# Patient Record
Sex: Female | Born: 1990 | Race: White | Hispanic: No | Marital: Married | Smoking: Never smoker
Health system: Southern US, Community
[De-identification: ages and names within clinical notes are randomized; demographics above are authoritative.]

## PROBLEM LIST (undated history)

## (undated) ENCOUNTER — Inpatient Hospital Stay (HOSPITAL_COMMUNITY): Payer: Self-pay

## (undated) DIAGNOSIS — Z789 Other specified health status: Secondary | ICD-10-CM

---

## 2011-10-16 ENCOUNTER — Other Ambulatory Visit: Payer: Self-pay | Admitting: Obstetrics and Gynecology

## 2011-10-16 DIAGNOSIS — N63 Unspecified lump in unspecified breast: Secondary | ICD-10-CM

## 2011-10-17 ENCOUNTER — Ambulatory Visit
Admission: RE | Admit: 2011-10-17 | Discharge: 2011-10-17 | Disposition: A | Discharge status: Home / Self Care | Payer: 59 | Source: Ambulatory Visit | Attending: Obstetrics and Gynecology | Admitting: Obstetrics and Gynecology

## 2011-10-17 DIAGNOSIS — N63 Unspecified lump in unspecified breast: Secondary | ICD-10-CM

## 2012-01-25 LAB — OB RESULTS CONSOLE ANTIBODY SCREEN: Antibody Screen: NEGATIVE

## 2012-01-25 LAB — OB RESULTS CONSOLE RUBELLA ANTIBODY, IGM: Rubella: IMMUNE

## 2012-01-25 LAB — OB RESULTS CONSOLE RPR: RPR: NONREACTIVE

## 2012-01-25 LAB — OB RESULTS CONSOLE ABO/RH: RH Type: NEGATIVE

## 2012-01-25 LAB — OB RESULTS CONSOLE HIV ANTIBODY (ROUTINE TESTING): HIV: NONREACTIVE

## 2012-08-14 LAB — OB RESULTS CONSOLE GBS: GBS: NEGATIVE

## 2012-09-01 ENCOUNTER — Inpatient Hospital Stay (HOSPITAL_COMMUNITY)
Admission: AD | Admit: 2012-09-01 | Discharge: 2012-09-05 | DRG: 766 | Disposition: A | Discharge status: Home / Self Care | Payer: 59 | Source: Ambulatory Visit | Attending: Obstetrics and Gynecology | Admitting: Obstetrics and Gynecology

## 2012-09-01 ENCOUNTER — Encounter (HOSPITAL_COMMUNITY): Payer: Self-pay | Admitting: *Deleted

## 2012-09-01 DIAGNOSIS — O339 Maternal care for disproportion, unspecified: Secondary | ICD-10-CM | POA: Diagnosis present

## 2012-09-01 DIAGNOSIS — O4290 Premature rupture of membranes, unspecified as to length of time between rupture and onset of labor, unspecified weeks of gestation: Secondary | ICD-10-CM | POA: Diagnosis present

## 2012-09-01 DIAGNOSIS — O330 Maternal care for disproportion due to deformity of maternal pelvic bones: Principal | ICD-10-CM | POA: Diagnosis present

## 2012-09-01 DIAGNOSIS — O9903 Anemia complicating the puerperium: Secondary | ICD-10-CM | POA: Diagnosis not present

## 2012-09-01 DIAGNOSIS — Z3480 Encounter for supervision of other normal pregnancy, unspecified trimester: Secondary | ICD-10-CM

## 2012-09-01 DIAGNOSIS — D649 Anemia, unspecified: Secondary | ICD-10-CM | POA: Diagnosis not present

## 2012-09-01 LAB — CBC
HCT: 31.2 % — ABNORMAL LOW (ref 36.0–46.0)
Hemoglobin: 10.4 g/dL — ABNORMAL LOW (ref 12.0–15.0)
MCH: 30.3 pg (ref 26.0–34.0)
MCHC: 33.3 g/dL (ref 30.0–36.0)
RBC: 3.43 MIL/uL — ABNORMAL LOW (ref 3.87–5.11)

## 2012-09-01 MED ORDER — ONDANSETRON HCL 4 MG/2ML IJ SOLN
4.0000 mg | Freq: Four times a day (QID) | INTRAMUSCULAR | Status: DC | PRN
  Administered 2012-09-02: 4 mg via INTRAVENOUS
  Filled 2012-09-01: qty 2

## 2012-09-01 MED ORDER — LACTATED RINGERS IV SOLN
INTRAVENOUS | Status: DC
  Administered 2012-09-02 (×2): via INTRAVENOUS
  Administered 2012-09-02: 125 mL/h via INTRAVENOUS

## 2012-09-01 MED ORDER — OXYTOCIN 40 UNITS IN LACTATED RINGERS INFUSION - SIMPLE MED
62.5000 mL/h | Freq: Once | INTRAVENOUS | Status: DC
  Filled 2012-09-01: qty 1000

## 2012-09-01 MED ORDER — IBUPROFEN 600 MG PO TABS: 600.0000 mg | ORAL_TABLET | Freq: Four times a day (QID) | ORAL | Status: DC | PRN

## 2012-09-01 MED ORDER — LIDOCAINE HCL (PF) 1 % IJ SOLN
30.0000 mL | INTRAMUSCULAR | Status: DC | PRN
  Filled 2012-09-01: qty 30

## 2012-09-01 MED ORDER — OXYTOCIN BOLUS FROM INFUSION
500.0000 mL | Freq: Once | INTRAVENOUS | Status: DC
  Filled 2012-09-01: qty 500

## 2012-09-01 MED ORDER — LACTATED RINGERS IV SOLN: 500.0000 mL | INTRAVENOUS | Status: DC | PRN

## 2012-09-01 MED ORDER — OXYCODONE-ACETAMINOPHEN 5-325 MG PO TABS: 1.0000 | ORAL_TABLET | ORAL | Status: DC | PRN

## 2012-09-01 MED ORDER — CITRIC ACID-SODIUM CITRATE 334-500 MG/5ML PO SOLN
30.0000 mL | ORAL | Status: DC | PRN
  Administered 2012-09-02 (×2): 30 mL via ORAL
  Filled 2012-09-01 (×2): qty 15

## 2012-09-01 MED ORDER — ACETAMINOPHEN 325 MG PO TABS: 650.0000 mg | ORAL_TABLET | ORAL | Status: DC | PRN

## 2012-09-01 NOTE — MAU Note (Signed)
Pt reports "my water broke" , reports same at 2051, clear fluid.

## 2012-09-02 ENCOUNTER — Inpatient Hospital Stay (HOSPITAL_COMMUNITY): Payer: 59 | Admitting: Anesthesiology

## 2012-09-02 ENCOUNTER — Encounter (HOSPITAL_COMMUNITY): Payer: Self-pay | Admitting: Anesthesiology

## 2012-09-02 ENCOUNTER — Encounter (HOSPITAL_COMMUNITY): Payer: Self-pay | Admitting: *Deleted

## 2012-09-02 ENCOUNTER — Encounter (HOSPITAL_COMMUNITY)
Admission: AD | Disposition: A | Discharge status: Home / Self Care | Payer: Self-pay | Source: Ambulatory Visit | Attending: Obstetrics and Gynecology

## 2012-09-02 LAB — RPR: RPR Ser Ql: NONREACTIVE

## 2012-09-02 SURGERY — Surgical Case
Anesthesia: Epidural | Site: Abdomen | Wound class: Clean Contaminated

## 2012-09-02 MED ORDER — OXYTOCIN 10 UNIT/ML IJ SOLN
INTRAMUSCULAR | Status: AC
  Filled 2012-09-02: qty 4

## 2012-09-02 MED ORDER — TERBUTALINE SULFATE 1 MG/ML IJ SOLN: 0.2500 mg | Freq: Once | INTRAMUSCULAR | Status: DC | PRN

## 2012-09-02 MED ORDER — CEFAZOLIN SODIUM-DEXTROSE 2-3 GM-% IV SOLR
INTRAVENOUS | Status: DC | PRN
  Administered 2012-09-02: 2 g via INTRAVENOUS

## 2012-09-02 MED ORDER — SODIUM BICARBONATE 8.4 % IV SOLN
INTRAVENOUS | Status: DC | PRN
  Administered 2012-09-02: 5 mL via EPIDURAL

## 2012-09-02 MED ORDER — EPHEDRINE 5 MG/ML INJ
10.0000 mg | INTRAVENOUS | Status: DC | PRN
  Filled 2012-09-02: qty 4

## 2012-09-02 MED ORDER — OXYTOCIN 10 UNIT/ML IJ SOLN
40.0000 [IU] | INTRAVENOUS | Status: DC | PRN
  Administered 2012-09-02: 40 [IU] via INTRAVENOUS

## 2012-09-02 MED ORDER — DIPHENHYDRAMINE HCL 50 MG/ML IJ SOLN
12.5000 mg | INTRAMUSCULAR | Status: DC | PRN
  Administered 2012-09-02 (×2): 12.5 mg via INTRAVENOUS
  Filled 2012-09-02 (×2): qty 1

## 2012-09-02 MED ORDER — EPHEDRINE 5 MG/ML INJ: 10.0000 mg | INTRAVENOUS | Status: DC | PRN

## 2012-09-02 MED ORDER — CEFAZOLIN SODIUM-DEXTROSE 2-3 GM-% IV SOLR
INTRAVENOUS | Status: AC
  Filled 2012-09-02: qty 50

## 2012-09-02 MED ORDER — 0.9 % SODIUM CHLORIDE (POUR BTL) OPTIME
TOPICAL | Status: DC | PRN
  Administered 2012-09-02: 1000 mL

## 2012-09-02 MED ORDER — PHENYLEPHRINE 40 MCG/ML (10ML) SYRINGE FOR IV PUSH (FOR BLOOD PRESSURE SUPPORT): 80.0000 ug | PREFILLED_SYRINGE | INTRAVENOUS | Status: DC | PRN

## 2012-09-02 MED ORDER — SODIUM BICARBONATE 8.4 % IV SOLN
INTRAVENOUS | Status: AC
  Filled 2012-09-02: qty 50

## 2012-09-02 MED ORDER — MORPHINE SULFATE 0.5 MG/ML IJ SOLN
INTRAMUSCULAR | Status: AC
  Filled 2012-09-02: qty 10

## 2012-09-02 MED ORDER — LIDOCAINE HCL (PF) 1 % IJ SOLN
INTRAMUSCULAR | Status: DC | PRN
  Administered 2012-09-02: 2 mL
  Administered 2012-09-02 (×3): 4 mL

## 2012-09-02 MED ORDER — ONDANSETRON HCL 4 MG/2ML IJ SOLN
INTRAMUSCULAR | Status: AC
  Filled 2012-09-02: qty 2

## 2012-09-02 MED ORDER — OXYTOCIN 40 UNITS IN LACTATED RINGERS INFUSION - SIMPLE MED
1.0000 m[IU]/min | INTRAVENOUS | Status: DC
  Administered 2012-09-02: 2 m[IU]/min via INTRAVENOUS

## 2012-09-02 MED ORDER — ONDANSETRON HCL 4 MG/2ML IJ SOLN
INTRAMUSCULAR | Status: DC | PRN
  Administered 2012-09-02: 4 mg via INTRAVENOUS

## 2012-09-02 MED ORDER — LIDOCAINE-EPINEPHRINE (PF) 2 %-1:200000 IJ SOLN
INTRAMUSCULAR | Status: AC
  Filled 2012-09-02: qty 20

## 2012-09-02 MED ORDER — LACTATED RINGERS IV SOLN
INTRAVENOUS | Status: DC | PRN
  Administered 2012-09-02 (×3): via INTRAVENOUS

## 2012-09-02 MED ORDER — LACTATED RINGERS IV SOLN
500.0000 mL | Freq: Once | INTRAVENOUS | Status: AC
  Administered 2012-09-02: 1000 mL via INTRAVENOUS

## 2012-09-02 MED ORDER — PHENYLEPHRINE 40 MCG/ML (10ML) SYRINGE FOR IV PUSH (FOR BLOOD PRESSURE SUPPORT)
80.0000 ug | PREFILLED_SYRINGE | INTRAVENOUS | Status: DC | PRN
  Filled 2012-09-02: qty 5

## 2012-09-02 MED ORDER — FENTANYL 2.5 MCG/ML BUPIVACAINE 1/10 % EPIDURAL INFUSION (WH - ANES)
14.0000 mL/h | INTRAMUSCULAR | Status: DC
  Administered 2012-09-02 (×3): 14 mL/h via EPIDURAL
  Filled 2012-09-02 (×3): qty 125

## 2012-09-02 SURGICAL SUPPLY — 33 items
CLOTH BEACON ORANGE TIMEOUT ST (SAFETY) ×2 IMPLANT
CONTAINER PREFILL 10% NBF 15ML (MISCELLANEOUS) IMPLANT
DRAPE SURG 17X23 STRL (DRAPES) ×2 IMPLANT
DRSG COVADERM 4X10 (GAUZE/BANDAGES/DRESSINGS) ×4 IMPLANT
DURAPREP 26ML APPLICATOR (WOUND CARE) ×2 IMPLANT
ELECT REM PT RETURN 9FT ADLT (ELECTROSURGICAL) ×2
ELECTRODE REM PT RTRN 9FT ADLT (ELECTROSURGICAL) ×1 IMPLANT
EXTRACTOR VACUUM M CUP 4 TUBE (SUCTIONS) IMPLANT
GLOVE ECLIPSE 6.0 STRL STRAW (GLOVE) ×2 IMPLANT
GLOVE ECLIPSE 6.5 STRL STRAW (GLOVE) ×2 IMPLANT
GOWN PREVENTION PLUS LG XLONG (DISPOSABLE) ×6 IMPLANT
KIT ABG SYR 3ML LUER SLIP (SYRINGE) IMPLANT
NEEDLE HYPO 25X5/8 SAFETYGLIDE (NEEDLE) ×2 IMPLANT
NS IRRIG 1000ML POUR BTL (IV SOLUTION) ×2 IMPLANT
PACK C SECTION WH (CUSTOM PROCEDURE TRAY) ×2 IMPLANT
PAD OB MATERNITY 4.3X12.25 (PERSONAL CARE ITEMS) ×2 IMPLANT
RTRCTR C-SECT PINK 25CM LRG (MISCELLANEOUS) ×2 IMPLANT
SLEEVE SCD COMPRESS KNEE MED (MISCELLANEOUS) ×2 IMPLANT
STAPLER VISISTAT 35W (STAPLE) ×2 IMPLANT
SUT PLAIN 0 NONE (SUTURE) IMPLANT
SUT VIC AB 0 CT1 27 (SUTURE) ×3
SUT VIC AB 0 CT1 27XBRD ANBCTR (SUTURE) ×3 IMPLANT
SUT VIC AB 1 CTX 36 (SUTURE) ×2
SUT VIC AB 1 CTX36XBRD ANBCTRL (SUTURE) ×2 IMPLANT
SUT VIC AB 3-0 CT1 27 (SUTURE) ×1
SUT VIC AB 3-0 CT1 TAPERPNT 27 (SUTURE) ×1 IMPLANT
SUT VIC AB 3-0 PS2 18 (SUTURE)
SUT VIC AB 3-0 PS2 18XBRD (SUTURE) IMPLANT
SUT VIC AB 3-0 SH 27 (SUTURE)
SUT VIC AB 3-0 SH 27X BRD (SUTURE) IMPLANT
TOWEL OR 17X24 6PK STRL BLUE (TOWEL DISPOSABLE) ×4 IMPLANT
TRAY FOLEY CATH 14FR (SET/KITS/TRAYS/PACK) IMPLANT
WATER STERILE IRR 1000ML POUR (IV SOLUTION) IMPLANT

## 2012-09-02 NOTE — H&P (Signed)
21 y.o. G1P0  Estimated Date of Delivery: 09/09/12 admitted at [redacted] weeks gestation with PROM.  Prenatal Transfer Tool  Maternal Diabetes: No Genetic Screening: Declined Maternal Ultrasounds/Referrals: Normal Fetal Ultrasounds or other Referrals:  None Maternal Substance Abuse:  No Significant Maternal Medications:  None Significant Maternal Lab Results: Lab values include: Rh negative, RhoGAM at 28 weeks Other Significant Pregnancy Complications:  PROM  Afebrile, VSS Heart and Lungs: No active disease Abdomen: soft, gravid, EFW AGA. Cervical exam:  4.5/80 (1/80 on admission)  Impression: PROM, now in labor with cervical change  Plan:  TOL, augment with pitocin if necessary

## 2012-09-02 NOTE — Progress Notes (Signed)
Patient entered 2nd stage at 1810h.  She pushed for 1 hour then rested for 1 hour.  She started pushing 2 hours ago and on exam she is still 0/+1 station.  The vertex is ROA and there does not appear to be significant molding or caput.  In view of the lack of descent after 3 hours of pushing, I recommended that we proceed with cesarean delivery and the patient agreed.

## 2012-09-02 NOTE — Progress Notes (Signed)
Called Dr. Claiborne Billings to request order for pain relief. Pt may have epidural. Provided update re: SVE.

## 2012-09-02 NOTE — Progress Notes (Signed)
Pt tired, 0 station, pt states is not feeling" much pressure", will labor down, pt put in left exaggerated sims.

## 2012-09-02 NOTE — Anesthesia Preprocedure Evaluation (Signed)
Anesthesia Evaluation  Patient identified by MRN, date of birth, ID band Patient awake    Reviewed: Allergy & Precautions, H&P , NPO status , Patient's Chart, lab work & pertinent test results, reviewed documented beta blocker date and time   History of Anesthesia Complications Negative for: history of anesthetic complications  Airway Mallampati: II TM Distance: >3 FB Neck ROM: full    Dental  (+) Teeth Intact   Pulmonary neg pulmonary ROS,  breath sounds clear to auscultation        Cardiovascular negative cardio ROS  Rhythm:regular Rate:Normal     Neuro/Psych negative neurological ROS  negative psych ROS   GI/Hepatic Neg liver ROS, GERD-  Medicated,  Endo/Other  negative endocrine ROS  Renal/GU negative Renal ROS     Musculoskeletal   Abdominal   Peds  Hematology negative hematology ROS (+)   Anesthesia Other Findings   Reproductive/Obstetrics (+) Pregnancy                           Anesthesia Physical Anesthesia Plan  ASA: II  Anesthesia Plan: Epidural   Post-op Pain Management:    Induction:   Airway Management Planned:   Additional Equipment:   Intra-op Plan:   Post-operative Plan:   Informed Consent: I have reviewed the patients History and Physical, chart, labs and discussed the procedure including the risks, benefits and alternatives for the proposed anesthesia with the patient or authorized representative who has indicated his/her understanding and acceptance.     Plan Discussed with:   Anesthesia Plan Comments:         Anesthesia Quick Evaluation  

## 2012-09-02 NOTE — Anesthesia Procedure Notes (Signed)
Epidural Patient location during procedure: OB Start time: 09/02/2012 1:56 AM  Staffing Performed by: anesthesiologist   Preanesthetic Checklist Completed: patient identified, site marked, surgical consent, pre-op evaluation, timeout performed, IV checked, risks and benefits discussed and monitors and equipment checked  Epidural Patient position: sitting Prep: site prepped and draped and DuraPrep Patient monitoring: continuous pulse ox and blood pressure Approach: midline Injection technique: LOR air  Needle:  Needle type: Tuohy  Needle gauge: 17 G Needle length: 9 cm and 9 Needle insertion depth: 5 cm cm Catheter type: closed end flexible Catheter size: 19 Gauge Catheter at skin depth: 10 cm Test dose: negative  Assessment Events: blood not aspirated, injection not painful, no injection resistance, negative IV test and no paresthesia  Additional Notes Discussed risk of headache, infection, bleeding, nerve injury and failed or incomplete block.  Patient voices understanding and wishes to proceed. Reason for block:procedure for pain

## 2012-09-03 ENCOUNTER — Encounter (HOSPITAL_COMMUNITY): Payer: Self-pay

## 2012-09-03 LAB — CBC
Hemoglobin: 8.7 g/dL — ABNORMAL LOW (ref 12.0–15.0)
MCH: 30.1 pg (ref 26.0–34.0)
MCHC: 32.6 g/dL (ref 30.0–36.0)
Platelets: 158 10*3/uL (ref 150–400)
RDW: 13.7 % (ref 11.5–15.5)

## 2012-09-03 LAB — ABO/RH: ABO/RH(D): A NEG

## 2012-09-03 MED ORDER — PRENATAL MULTIVITAMIN CH
1.0000 | ORAL_TABLET | Freq: Every day | ORAL | Status: DC
  Administered 2012-09-03 – 2012-09-05 (×3): 1 via ORAL
  Filled 2012-09-03 (×4): qty 1

## 2012-09-03 MED ORDER — ONDANSETRON HCL 4 MG PO TABS: 4.0000 mg | ORAL_TABLET | ORAL | Status: DC | PRN

## 2012-09-03 MED ORDER — SENNOSIDES-DOCUSATE SODIUM 8.6-50 MG PO TABS
2.0000 | ORAL_TABLET | Freq: Every day | ORAL | Status: DC
  Administered 2012-09-03 – 2012-09-04 (×2): 2 via ORAL

## 2012-09-03 MED ORDER — FENTANYL CITRATE 0.05 MG/ML IJ SOLN
INTRAMUSCULAR | Status: AC
  Filled 2012-09-03: qty 2

## 2012-09-03 MED ORDER — TETANUS-DIPHTH-ACELL PERTUSSIS 5-2.5-18.5 LF-MCG/0.5 IM SUSP: 0.5000 mL | Freq: Once | INTRAMUSCULAR | Status: DC

## 2012-09-03 MED ORDER — MORPHINE SULFATE (PF) 0.5 MG/ML IJ SOLN
INTRAMUSCULAR | Status: DC | PRN
  Administered 2012-09-03: 4 mg via EPIDURAL

## 2012-09-03 MED ORDER — OXYTOCIN 40 UNITS IN LACTATED RINGERS INFUSION - SIMPLE MED: 62.5000 mL/h | INTRAVENOUS | Status: AC

## 2012-09-03 MED ORDER — WITCH HAZEL-GLYCERIN EX PADS: 1.0000 "application " | MEDICATED_PAD | CUTANEOUS | Status: DC | PRN

## 2012-09-03 MED ORDER — DIBUCAINE 1 % RE OINT: 1.0000 "application " | TOPICAL_OINTMENT | RECTAL | Status: DC | PRN

## 2012-09-03 MED ORDER — FERROUS SULFATE 325 (65 FE) MG PO TABS
325.0000 mg | ORAL_TABLET | Freq: Two times a day (BID) | ORAL | Status: DC
  Administered 2012-09-03 – 2012-09-05 (×4): 325 mg via ORAL
  Filled 2012-09-03 (×4): qty 1

## 2012-09-03 MED ORDER — FENTANYL CITRATE 0.05 MG/ML IJ SOLN
25.0000 ug | INTRAMUSCULAR | Status: DC | PRN
  Administered 2012-09-03: 50 ug via INTRAVENOUS

## 2012-09-03 MED ORDER — ZOLPIDEM TARTRATE 5 MG PO TABS: 5.0000 mg | ORAL_TABLET | Freq: Every evening | ORAL | Status: DC | PRN

## 2012-09-03 MED ORDER — KETOROLAC TROMETHAMINE 60 MG/2ML IM SOLN
60.0000 mg | Freq: Once | INTRAMUSCULAR | Status: AC | PRN
  Administered 2012-09-03: 60 mg via INTRAMUSCULAR

## 2012-09-03 MED ORDER — FENTANYL CITRATE 0.05 MG/ML IJ SOLN
INTRAMUSCULAR | Status: DC | PRN
  Administered 2012-09-03: 100 ug via INTRAVENOUS

## 2012-09-03 MED ORDER — KETOROLAC TROMETHAMINE 30 MG/ML IJ SOLN: 30.0000 mg | Freq: Four times a day (QID) | INTRAMUSCULAR | Status: AC | PRN

## 2012-09-03 MED ORDER — ONDANSETRON HCL 4 MG/2ML IJ SOLN: 4.0000 mg | INTRAMUSCULAR | Status: DC | PRN

## 2012-09-03 MED ORDER — SIMETHICONE 80 MG PO CHEW: 80.0000 mg | CHEWABLE_TABLET | ORAL | Status: DC | PRN

## 2012-09-03 MED ORDER — METOCLOPRAMIDE HCL 5 MG/ML IJ SOLN: 10.0000 mg | Freq: Three times a day (TID) | INTRAMUSCULAR | Status: DC | PRN

## 2012-09-03 MED ORDER — KETOROLAC TROMETHAMINE 60 MG/2ML IM SOLN
INTRAMUSCULAR | Status: AC
  Filled 2012-09-03: qty 2

## 2012-09-03 MED ORDER — DIPHENHYDRAMINE HCL 25 MG PO CAPS: 25.0000 mg | ORAL_CAPSULE | ORAL | Status: DC | PRN

## 2012-09-03 MED ORDER — DIPHENHYDRAMINE HCL 50 MG/ML IJ SOLN: 12.5000 mg | INTRAMUSCULAR | Status: DC | PRN

## 2012-09-03 MED ORDER — MENTHOL 3 MG MT LOZG: 1.0000 | LOZENGE | OROMUCOSAL | Status: DC | PRN

## 2012-09-03 MED ORDER — NALBUPHINE SYRINGE 5 MG/0.5 ML
5.0000 mg | INJECTION | INTRAMUSCULAR | Status: DC | PRN
  Administered 2012-09-03: 5 mg via SUBCUTANEOUS
  Filled 2012-09-03: qty 1

## 2012-09-03 MED ORDER — MORPHINE SULFATE (PF) 0.5 MG/ML IJ SOLN
INTRAMUSCULAR | Status: DC | PRN
  Administered 2012-09-03: 1 mg via INTRAVENOUS

## 2012-09-03 MED ORDER — OXYCODONE-ACETAMINOPHEN 5-325 MG PO TABS
1.0000 | ORAL_TABLET | ORAL | Status: DC | PRN
  Administered 2012-09-03 – 2012-09-04 (×4): 1 via ORAL
  Administered 2012-09-05: 2 via ORAL
  Administered 2012-09-05: 1 via ORAL
  Filled 2012-09-03 (×3): qty 1
  Filled 2012-09-03: qty 2
  Filled 2012-09-03 (×2): qty 1

## 2012-09-03 MED ORDER — SIMETHICONE 80 MG PO CHEW
80.0000 mg | CHEWABLE_TABLET | Freq: Three times a day (TID) | ORAL | Status: DC
  Administered 2012-09-03 – 2012-09-05 (×9): 80 mg via ORAL

## 2012-09-03 MED ORDER — MEPERIDINE HCL 25 MG/ML IJ SOLN: 6.2500 mg | INTRAMUSCULAR | Status: DC | PRN

## 2012-09-03 MED ORDER — LACTATED RINGERS IV SOLN: INTRAVENOUS | Status: DC

## 2012-09-03 MED ORDER — SCOPOLAMINE 1 MG/3DAYS TD PT72
1.0000 | MEDICATED_PATCH | Freq: Once | TRANSDERMAL | Status: DC
  Administered 2012-09-03: 1.5 mg via TRANSDERMAL

## 2012-09-03 MED ORDER — SODIUM CHLORIDE 0.9 % IV SOLN
1.0000 ug/kg/h | INTRAVENOUS | Status: DC | PRN
  Filled 2012-09-03: qty 2.5

## 2012-09-03 MED ORDER — ONDANSETRON HCL 4 MG/2ML IJ SOLN: 4.0000 mg | Freq: Three times a day (TID) | INTRAMUSCULAR | Status: DC | PRN

## 2012-09-03 MED ORDER — SODIUM CHLORIDE 0.9 % IJ SOLN: 3.0000 mL | INTRAMUSCULAR | Status: DC | PRN

## 2012-09-03 MED ORDER — NALBUPHINE SYRINGE 5 MG/0.5 ML
5.0000 mg | INJECTION | INTRAMUSCULAR | Status: DC | PRN
  Filled 2012-09-03: qty 1
  Filled 2012-09-03: qty 0.5

## 2012-09-03 MED ORDER — SCOPOLAMINE 1 MG/3DAYS TD PT72
MEDICATED_PATCH | TRANSDERMAL | Status: AC
  Filled 2012-09-03: qty 1

## 2012-09-03 MED ORDER — IBUPROFEN 600 MG PO TABS: 600.0000 mg | ORAL_TABLET | Freq: Four times a day (QID) | ORAL | Status: DC

## 2012-09-03 MED ORDER — NALOXONE HCL 0.4 MG/ML IJ SOLN: 0.4000 mg | INTRAMUSCULAR | Status: DC | PRN

## 2012-09-03 MED ORDER — IBUPROFEN 600 MG PO TABS
600.0000 mg | ORAL_TABLET | Freq: Four times a day (QID) | ORAL | Status: DC
  Administered 2012-09-03 – 2012-09-05 (×9): 600 mg via ORAL
  Filled 2012-09-03 (×9): qty 1

## 2012-09-03 MED ORDER — LANOLIN HYDROUS EX OINT: 1.0000 "application " | TOPICAL_OINTMENT | CUTANEOUS | Status: DC | PRN

## 2012-09-03 MED ORDER — DIPHENHYDRAMINE HCL 50 MG/ML IJ SOLN: 25.0000 mg | INTRAMUSCULAR | Status: DC | PRN

## 2012-09-03 MED ORDER — IBUPROFEN 600 MG PO TABS: 600.0000 mg | ORAL_TABLET | Freq: Four times a day (QID) | ORAL | Status: DC | PRN

## 2012-09-03 MED ORDER — DIPHENHYDRAMINE HCL 25 MG PO CAPS: 25.0000 mg | ORAL_CAPSULE | Freq: Four times a day (QID) | ORAL | Status: DC | PRN

## 2012-09-03 NOTE — Anesthesia Postprocedure Evaluation (Signed)
Anesthesia Post Note  Patient: Bailey Woods  Procedure(s) Performed: Procedure(s) (LRB): CESAREAN SECTION (N/A)  Anesthesia type: Epidural  Patient location: PACU  Post pain: Pain level controlled  Post assessment: Post-op Vital signs reviewed  Last Vitals:  Filed Vitals:   09/03/12 0145  BP: 136/83  Pulse: 89  Temp: 37.2 C  Resp: 17    Post vital signs: stable  Level of consciousness: awake  Complications: No apparent anesthesia complications

## 2012-09-03 NOTE — Op Note (Addendum)
Patient Name: Bailey Woods MRN: [id number]  Date of Surgery: 09/03/2012    PREOPERATIVE DIAGNOSIS: Cephlo Pelvic Disproportion  POSTOPERATIVE DIAGNOSIS: Cephlo Pelvic Disproportion   PROCEDURE: Low transverse cesarean section  SURGEON: Caralyn Guile. Arlyce Dice M.D.  ANESTHESIA: Epidural  ESTIMATED BLOOD LOSS: 800 ml  FINDINGS: Female, BW: 8 lbs. 10 oz. , Apgar 9,9; normal adnexa and uterus.   INDICATIONS: 2nd stage arrest.  Possible macrosomia.   PROCEDURE IN DETAIL: The patient was taken to the operating room and the epidural that had been placed in labor was re injected for surgical anesthesia.  She was then placed in the supine position with left lateral displacement of the uterus. The abdomen was prepped and draped in a sterile fashion and the bladder was catheterized.  A low transverse abdominal incision was made and carried down to the fascia. The fascia was opened transversely and the rectus sheath was dissected from the underlying rectus muscle. The rectus midline was identified and opened by sharp and blunt dissection. The peritoneum was opened. An Alexis retractor was placed and the lower uterine segment was identified, entered transversely by careful sharp dissection, and extended bluntly.  The infant was delivered without difficulty. The placenta was delivered with cord traction and uterine massage. The uterus was bluntly curettage. The lower segment was closed with running interlocking Vicryl 1 suture.  A second imbricating Vicryl 1 suture line was placed. The peritoneum and rectus muscle were closed in the midline with running 3-0 Vicryl suture. The fascia was closed with running 0 Vicryl suture and the skin was closed with staples. All sponge and instrument counts were correct.  The patient tolerated the procedure well and left the operating room in good condition.

## 2012-09-03 NOTE — Addendum Note (Signed)
Addendum  created 09/03/12 0754 by Suella Grove, CRNA   Modules edited:Notes Section

## 2012-09-03 NOTE — Anesthesia Postprocedure Evaluation (Signed)
  Anesthesia Post-op Note  Patient: Bailey Woods  Procedure(s) Performed: Procedure(s) (LRB) with comments: CESAREAN SECTION (N/A) - Primary Cesarean Section Delivery Baby Boy @ 2354, Apgars   Patient Location: Mother/Baby  Anesthesia Type: Epidural  Level of Consciousness: awake, alert  and oriented  Airway and Oxygen Therapy: Patient Spontanous Breathing  Post-op Pain: none  Post-op Assessment: Patient's Cardiovascular Status Stable, Respiratory Function Stable, Patent Airway, No signs of Nausea or vomiting, Adequate PO intake, Pain level controlled, No headache, No backache, No residual numbness and No residual motor weakness  Post-op Vital Signs: Reviewed and stable  Complications: No apparent anesthesia complications

## 2012-09-03 NOTE — Transfer of Care (Signed)
Immediate Anesthesia Transfer of Care Note  Patient: Bailey Woods  Procedure(s) Performed: Procedure(s) (LRB) with comments: CESAREAN SECTION (N/A) - Primary Cesarean Section Delivery Baby Boy @ 2354, Apgars   Patient Location: PACU  Anesthesia Type: Epidural  Level of Consciousness: awake  Airway & Oxygen Therapy: Patient Spontanous Breathing  Post-op Assessment: Report given to PACU RN and Post -op Vital signs reviewed and stable  Post vital signs: stable  Complications: No apparent anesthesia complications

## 2012-09-03 NOTE — Progress Notes (Signed)
Pain control is good.  Bleeding minimal.  NO CP/SOB/N/V/leg pain.   Filed Vitals:   09/03/12 0500 09/03/12 0559 09/03/12 0600 09/03/12 0821  BP: 113/70 115/73  105/63  Pulse: 78 83  84  Temp: 98.8 F (37.1 C) 98.4 F (36.9 C)  98.1 F (36.7 C)  TempSrc: Oral Oral  Oral  Resp: 18 18  16   Height:      Weight:      SpO2: 94%  95% 95%    Fundus firm Incision: c/d/i Ext: no CT  Lab Results  Component Value Date   WBC 13.6* 09/03/2012   HGB 8.7* 09/03/2012   HCT 26.7* 09/03/2012   MCV 92.4 09/03/2012   PLT 158 09/03/2012    --/--/A NEG (10/15 0540)  A/P Post op day 1 Foley removal per protocol Encourage ambulation Advance diet as tolerated. Anemia - start FeSO4 bid  Routine care.    Philip Aspen

## 2012-09-04 ENCOUNTER — Encounter (HOSPITAL_COMMUNITY): Payer: Self-pay | Admitting: Obstetrics & Gynecology

## 2012-09-04 NOTE — Progress Notes (Signed)
Post Op Day 2 Subjective: no complaints.  Objective: Blood pressure 120/83, pulse 91, temperature 97.7 F (36.5 C), temperature source Oral, resp. rate 18, height 5\' 2"  (1.575 m), weight 198 lb (89.812 kg), SpO2 96.00%, unknown if currently breastfeeding.  Physical Exam:  General: fatigued Lochia: appropriate Uterine Fundus: firm Incision: no significant drainage   Basename 09/03/12 0540 09/01/12 2328  HGB 8.7* 10.4*  HCT 26.7* 31.2*    Assessment/Plan: Plan for discharge tomorrow   LOS: 3 days   Skipper Dacosta D 09/04/2012, 8:48 AM

## 2012-09-05 MED ORDER — TETANUS-DIPHTH-ACELL PERTUSSIS 5-2.5-18.5 LF-MCG/0.5 IM SUSP
0.5000 mL | Freq: Once | INTRAMUSCULAR | Status: AC
  Administered 2012-09-05: 0.5 mL via INTRAMUSCULAR
  Filled 2012-09-05: qty 0.5

## 2012-09-05 MED ORDER — OXYCODONE-ACETAMINOPHEN 5-325 MG PO TABS: 1.0000 | ORAL_TABLET | ORAL | Status: DC | PRN

## 2012-09-05 MED ORDER — IBUPROFEN 600 MG PO TABS: 600.0000 mg | ORAL_TABLET | Freq: Four times a day (QID) | ORAL | Status: DC

## 2012-09-05 NOTE — Discharge Summary (Signed)
[name], Bailey Woods NO.:  [id number]  MEDICAL RECORD NO.:  [id number]  LOCATION:  9108                          FACILITY:  [facility]  PHYSICIAN:  Malva Limes, M.D.    DATE OF BIRTH:  [birth year]  DATE OF ADMISSION:  09/02/2012 DATE OF DISCHARGE:  09/05/2012                              DISCHARGE SUMMARY   PREOPERATIVE DIAGNOSES: 1. Intrauterine pregnancy at term 2. Cephalopelvic disproportion.  PRINCIPAL PROCEDURES:  Primary low transverse cesarean section.  HISTORY OF PRESENT ILLNESS:  [name] is a 21 year old white female, G1 P0, who presented to [facility] complaining of rupture of membranes on September 02, 2012.  The patient was admitted by Dr. Ilda Mori.  She completed the first stage without difficulty.  The patient pushed for 3 hours and made no progress.  At that point, she was taken to the operating room, where a primary low transverse cesarean section was performed by Dr. Ilda Mori.  A complete description of this can be found in dictated operative note.  The patient delivered 1 live viable white female infant, weighing 8 pounds and 10 ounces.  The Apgars were 9 at 1 minute and 9 at 5 minutes.  The patient's postoperative course was benign.  The patient's postop hemoglobin 8.7.  She was asymptomatic from this.  The patient was discharged to home on postoperative day #3.  At that time, her incision was healing well.  She was then ambulating without difficulty.  She was afebrile and tolerating a diet.  Staples were removed prior to discharge.  She was sent home with Percocet and iron to take.  She will follow up in the office in 4 weeks.          ______________________________ Malva Limes, M.D.     MA/MEDQ  D:  09/05/2012  T:  09/05/2012  Job:  [id number]

## 2012-09-05 NOTE — Progress Notes (Signed)
POD#3 Pt without complaints. Ready for discharge. VSSAF Incision- healing well. IMP/ stable Plan/ will discharge.

## 2013-06-26 LAB — OB RESULTS CONSOLE GC/CHLAMYDIA
Chlamydia: NEGATIVE
Gonorrhea: NEGATIVE

## 2013-06-26 LAB — OB RESULTS CONSOLE RPR: RPR: NONREACTIVE

## 2013-06-26 LAB — OB RESULTS CONSOLE ABO/RH: RH TYPE: NEGATIVE

## 2013-06-26 LAB — OB RESULTS CONSOLE HEPATITIS B SURFACE ANTIGEN: Hepatitis B Surface Ag: NEGATIVE

## 2013-06-26 LAB — OB RESULTS CONSOLE HIV ANTIBODY (ROUTINE TESTING): HIV: NONREACTIVE

## 2013-06-26 LAB — OB RESULTS CONSOLE ANTIBODY SCREEN: Antibody Screen: NEGATIVE

## 2013-06-26 LAB — OB RESULTS CONSOLE RUBELLA ANTIBODY, IGM: Rubella: IMMUNE

## 2014-01-05 LAB — OB RESULTS CONSOLE GBS: STREP GROUP B AG: NEGATIVE

## 2014-01-30 ENCOUNTER — Telehealth (HOSPITAL_COMMUNITY): Payer: Self-pay | Admitting: *Deleted

## 2014-01-30 NOTE — Telephone Encounter (Signed)
Preadmission screen  

## 2014-02-02 ENCOUNTER — Encounter (HOSPITAL_COMMUNITY): Payer: Self-pay

## 2014-02-02 ENCOUNTER — Inpatient Hospital Stay (HOSPITAL_COMMUNITY)
Admission: RE | Admit: 2014-02-02 | Discharge: 2014-02-04 | DRG: 766 | Disposition: A | Discharge status: Home / Self Care | Payer: 59 | Source: Ambulatory Visit | Attending: Obstetrics and Gynecology | Admitting: Obstetrics and Gynecology

## 2014-02-02 ENCOUNTER — Inpatient Hospital Stay (HOSPITAL_COMMUNITY): Payer: 59 | Admitting: Anesthesiology

## 2014-02-02 ENCOUNTER — Inpatient Hospital Stay (HOSPITAL_COMMUNITY): Admission: AD | Admit: 2014-02-02 | Payer: 59 | Source: Ambulatory Visit | Admitting: Obstetrics and Gynecology

## 2014-02-02 ENCOUNTER — Encounter (HOSPITAL_COMMUNITY)
Admission: RE | Disposition: A | Discharge status: Home / Self Care | Payer: Self-pay | Source: Ambulatory Visit | Attending: Obstetrics and Gynecology

## 2014-02-02 ENCOUNTER — Encounter (HOSPITAL_COMMUNITY): Payer: 59 | Admitting: Anesthesiology

## 2014-02-02 DIAGNOSIS — O360990 Maternal care for other rhesus isoimmunization, unspecified trimester, not applicable or unspecified: Secondary | ICD-10-CM | POA: Diagnosis present

## 2014-02-02 DIAGNOSIS — O34219 Maternal care for unspecified type scar from previous cesarean delivery: Principal | ICD-10-CM | POA: Diagnosis present

## 2014-02-02 HISTORY — DX: Other specified health status: Z78.9

## 2014-02-02 LAB — RPR: RPR: NONREACTIVE

## 2014-02-02 LAB — CBC
HCT: 37.2 % (ref 36.0–46.0)
Hemoglobin: 12.7 g/dL (ref 12.0–15.0)
MCH: 32.2 pg (ref 26.0–34.0)
MCHC: 34.1 g/dL (ref 30.0–36.0)
MCV: 94.2 fL (ref 78.0–100.0)
Platelets: 143 10*3/uL — ABNORMAL LOW (ref 150–400)
RBC: 3.95 MIL/uL (ref 3.87–5.11)
RDW: 13.8 % (ref 11.5–15.5)
WBC: 9 10*3/uL (ref 4.0–10.5)

## 2014-02-02 LAB — TYPE AND SCREEN
ABO/RH(D): A NEG
Antibody Screen: NEGATIVE

## 2014-02-02 SURGERY — Surgical Case
Anesthesia: Spinal | Site: Abdomen

## 2014-02-02 MED ORDER — MEPERIDINE HCL 25 MG/ML IJ SOLN: 6.2500 mg | INTRAMUSCULAR | Status: DC | PRN

## 2014-02-02 MED ORDER — NALOXONE HCL 0.4 MG/ML IJ SOLN: 0.4000 mg | INTRAMUSCULAR | Status: DC | PRN

## 2014-02-02 MED ORDER — SCOPOLAMINE 1 MG/3DAYS TD PT72: 1.0000 | MEDICATED_PATCH | Freq: Once | TRANSDERMAL | Status: DC

## 2014-02-02 MED ORDER — ONDANSETRON HCL 4 MG/2ML IJ SOLN: 4.0000 mg | Freq: Three times a day (TID) | INTRAMUSCULAR | Status: DC | PRN

## 2014-02-02 MED ORDER — IBUPROFEN 600 MG PO TABS: 600.0000 mg | ORAL_TABLET | Freq: Four times a day (QID) | ORAL | Status: DC | PRN

## 2014-02-02 MED ORDER — TERBUTALINE SULFATE 1 MG/ML IJ SOLN: 0.2500 mg | Freq: Once | INTRAMUSCULAR | Status: DC | PRN

## 2014-02-02 MED ORDER — FLEET ENEMA 7-19 GM/118ML RE ENEM: 1.0000 | ENEMA | RECTAL | Status: DC | PRN

## 2014-02-02 MED ORDER — MENTHOL 3 MG MT LOZG: 1.0000 | LOZENGE | OROMUCOSAL | Status: DC | PRN

## 2014-02-02 MED ORDER — ACETAMINOPHEN 500 MG PO TABS
1000.0000 mg | ORAL_TABLET | Freq: Four times a day (QID) | ORAL | Status: AC
  Filled 2014-02-02: qty 2

## 2014-02-02 MED ORDER — CITRIC ACID-SODIUM CITRATE 334-500 MG/5ML PO SOLN
30.0000 mL | ORAL | Status: DC | PRN
Start: 2014-02-02 — End: 2014-02-02
  Administered 2014-02-02: 30 mL via ORAL
  Filled 2014-02-02: qty 15

## 2014-02-02 MED ORDER — OXYCODONE-ACETAMINOPHEN 5-325 MG PO TABS
1.0000 | ORAL_TABLET | ORAL | Status: DC | PRN
  Administered 2014-02-03 – 2014-02-04 (×4): 1 via ORAL
  Filled 2014-02-02 (×4): qty 1

## 2014-02-02 MED ORDER — WITCH HAZEL-GLYCERIN EX PADS: 1.0000 "application " | MEDICATED_PAD | CUTANEOUS | Status: DC | PRN

## 2014-02-02 MED ORDER — DIPHENHYDRAMINE HCL 50 MG/ML IJ SOLN: 12.5000 mg | INTRAMUSCULAR | Status: DC | PRN

## 2014-02-02 MED ORDER — OXYCODONE-ACETAMINOPHEN 5-325 MG PO TABS: 1.0000 | ORAL_TABLET | ORAL | Status: DC | PRN

## 2014-02-02 MED ORDER — IBUPROFEN 600 MG PO TABS
600.0000 mg | ORAL_TABLET | Freq: Four times a day (QID) | ORAL | Status: DC
Start: 2014-02-03 — End: 2014-02-04
  Administered 2014-02-03 – 2014-02-04 (×6): 600 mg via ORAL
  Filled 2014-02-02 (×7): qty 1

## 2014-02-02 MED ORDER — ONDANSETRON HCL 4 MG/2ML IJ SOLN: 4.0000 mg | INTRAMUSCULAR | Status: DC | PRN

## 2014-02-02 MED ORDER — SODIUM CHLORIDE 0.9 % IJ SOLN: 3.0000 mL | INTRAMUSCULAR | Status: DC | PRN

## 2014-02-02 MED ORDER — OXYTOCIN 40 UNITS IN LACTATED RINGERS INFUSION - SIMPLE MED: 1.0000 m[IU]/min | INTRAVENOUS | Status: DC

## 2014-02-02 MED ORDER — FENTANYL CITRATE 0.05 MG/ML IJ SOLN
INTRAMUSCULAR | Status: DC | PRN
  Administered 2014-02-02: 25 ug via INTRATHECAL

## 2014-02-02 MED ORDER — MORPHINE SULFATE (PF) 0.5 MG/ML IJ SOLN
INTRAMUSCULAR | Status: DC | PRN
  Administered 2014-02-02: .15 mg via INTRATHECAL

## 2014-02-02 MED ORDER — KETOROLAC TROMETHAMINE 30 MG/ML IJ SOLN
INTRAMUSCULAR | Status: AC
  Filled 2014-02-02: qty 1

## 2014-02-02 MED ORDER — OXYTOCIN 40 UNITS IN LACTATED RINGERS INFUSION - SIMPLE MED: 62.5000 mL/h | INTRAVENOUS | Status: AC

## 2014-02-02 MED ORDER — PHENYLEPHRINE 8 MG IN D5W 100 ML (0.08MG/ML) PREMIX OPTIME
INJECTION | INTRAVENOUS | Status: AC
  Filled 2014-02-02: qty 100

## 2014-02-02 MED ORDER — MIDAZOLAM HCL 2 MG/2ML IJ SOLN: 0.5000 mg | Freq: Once | INTRAMUSCULAR | Status: DC | PRN

## 2014-02-02 MED ORDER — PRENATAL MULTIVITAMIN CH
1.0000 | ORAL_TABLET | Freq: Every day | ORAL | Status: DC
  Administered 2014-02-03 – 2014-02-04 (×2): 1 via ORAL
  Filled 2014-02-02 (×2): qty 1

## 2014-02-02 MED ORDER — ONDANSETRON HCL 4 MG/2ML IJ SOLN
INTRAMUSCULAR | Status: DC | PRN
  Administered 2014-02-02: 4 mg via INTRAVENOUS

## 2014-02-02 MED ORDER — SCOPOLAMINE 1 MG/3DAYS TD PT72
MEDICATED_PATCH | TRANSDERMAL | Status: AC
  Filled 2014-02-02: qty 1

## 2014-02-02 MED ORDER — FENTANYL CITRATE 0.05 MG/ML IJ SOLN
25.0000 ug | INTRAMUSCULAR | Status: DC | PRN
Start: 2014-02-02 — End: 2014-02-02

## 2014-02-02 MED ORDER — NALBUPHINE HCL 10 MG/ML IJ SOLN: 5.0000 mg | INTRAMUSCULAR | Status: DC | PRN

## 2014-02-02 MED ORDER — OXYTOCIN 10 UNIT/ML IJ SOLN
INTRAMUSCULAR | Status: AC
  Filled 2014-02-02: qty 4

## 2014-02-02 MED ORDER — SCOPOLAMINE 1 MG/3DAYS TD PT72
MEDICATED_PATCH | TRANSDERMAL | Status: DC | PRN
  Administered 2014-02-02: 1 via TRANSDERMAL

## 2014-02-02 MED ORDER — KETOROLAC TROMETHAMINE 30 MG/ML IJ SOLN
30.0000 mg | Freq: Four times a day (QID) | INTRAMUSCULAR | Status: AC | PRN
  Administered 2014-02-02: 30 mg via INTRAMUSCULAR

## 2014-02-02 MED ORDER — PROMETHAZINE HCL 25 MG/ML IJ SOLN: 6.2500 mg | INTRAMUSCULAR | Status: DC | PRN

## 2014-02-02 MED ORDER — LACTATED RINGERS IV SOLN
INTRAVENOUS | Status: DC
  Administered 2014-02-02: 15:00:00 via INTRAVENOUS

## 2014-02-02 MED ORDER — LIDOCAINE HCL (PF) 1 % IJ SOLN: 30.0000 mL | INTRAMUSCULAR | Status: DC | PRN

## 2014-02-02 MED ORDER — LACTATED RINGERS IV SOLN
40.0000 [IU] | INTRAVENOUS | Status: DC | PRN
  Administered 2014-02-02: 40 [IU] via INTRAVENOUS

## 2014-02-02 MED ORDER — DIPHENHYDRAMINE HCL 25 MG PO CAPS: 25.0000 mg | ORAL_CAPSULE | ORAL | Status: DC | PRN

## 2014-02-02 MED ORDER — PHENYLEPHRINE 8 MG IN D5W 100 ML (0.08MG/ML) PREMIX OPTIME
INJECTION | INTRAVENOUS | Status: DC | PRN
  Administered 2014-02-02: 60 ug/min via INTRAVENOUS

## 2014-02-02 MED ORDER — NALBUPHINE HCL 10 MG/ML IJ SOLN
5.0000 mg | INTRAMUSCULAR | Status: DC | PRN
  Administered 2014-02-02 – 2014-02-03 (×3): 10 mg via SUBCUTANEOUS
  Filled 2014-02-02 (×2): qty 1

## 2014-02-02 MED ORDER — CEFAZOLIN SODIUM-DEXTROSE 2-3 GM-% IV SOLR
2.0000 g | INTRAVENOUS | Status: AC
  Administered 2014-02-02: 2 g via INTRAVENOUS
  Filled 2014-02-02: qty 50

## 2014-02-02 MED ORDER — DEXTROSE 5 % IV SOLN: 1.0000 ug/kg/h | INTRAVENOUS | Status: DC | PRN

## 2014-02-02 MED ORDER — LACTATED RINGERS IV SOLN
INTRAVENOUS | Status: DC | PRN
  Administered 2014-02-02: 16:00:00 via INTRAVENOUS

## 2014-02-02 MED ORDER — ZOLPIDEM TARTRATE 5 MG PO TABS: 5.0000 mg | ORAL_TABLET | Freq: Every evening | ORAL | Status: DC | PRN

## 2014-02-02 MED ORDER — NALBUPHINE HCL 10 MG/ML IJ SOLN
INTRAMUSCULAR | Status: AC
  Administered 2014-02-03: 10 mg via SUBCUTANEOUS
  Filled 2014-02-02: qty 1

## 2014-02-02 MED ORDER — SIMETHICONE 80 MG PO CHEW
80.0000 mg | CHEWABLE_TABLET | ORAL | Status: DC
  Administered 2014-02-03 (×2): 80 mg via ORAL
  Filled 2014-02-02 (×2): qty 1

## 2014-02-02 MED ORDER — SENNOSIDES-DOCUSATE SODIUM 8.6-50 MG PO TABS
2.0000 | ORAL_TABLET | ORAL | Status: DC
  Administered 2014-02-03 (×2): 2 via ORAL
  Filled 2014-02-02 (×2): qty 2

## 2014-02-02 MED ORDER — BUPIVACAINE IN DEXTROSE 0.75-8.25 % IT SOLN
INTRATHECAL | Status: DC | PRN
  Administered 2014-02-02: 11 mg via INTRATHECAL

## 2014-02-02 MED ORDER — DIPHENHYDRAMINE HCL 50 MG/ML IJ SOLN: 25.0000 mg | INTRAMUSCULAR | Status: DC | PRN

## 2014-02-02 MED ORDER — LACTATED RINGERS IV SOLN
INTRAVENOUS | Status: DC
  Administered 2014-02-02 (×3): via INTRAVENOUS

## 2014-02-02 MED ORDER — LACTATED RINGERS IV SOLN: 500.0000 mL | INTRAVENOUS | Status: DC | PRN

## 2014-02-02 MED ORDER — LANOLIN HYDROUS EX OINT: 1.0000 "application " | TOPICAL_OINTMENT | CUTANEOUS | Status: DC | PRN

## 2014-02-02 MED ORDER — ONDANSETRON HCL 4 MG/2ML IJ SOLN
INTRAMUSCULAR | Status: AC
  Filled 2014-02-02: qty 2

## 2014-02-02 MED ORDER — SIMETHICONE 80 MG PO CHEW: 80.0000 mg | CHEWABLE_TABLET | ORAL | Status: DC | PRN

## 2014-02-02 MED ORDER — MORPHINE SULFATE 0.5 MG/ML IJ SOLN
INTRAMUSCULAR | Status: AC
  Filled 2014-02-02: qty 10

## 2014-02-02 MED ORDER — OXYTOCIN BOLUS FROM INFUSION: 500.0000 mL | INTRAVENOUS | Status: DC

## 2014-02-02 MED ORDER — LACTATED RINGERS IV SOLN
INTRAVENOUS | Status: DC
  Administered 2014-02-03: 1000 mL via INTRAVENOUS

## 2014-02-02 MED ORDER — FENTANYL CITRATE 0.05 MG/ML IJ SOLN
INTRAMUSCULAR | Status: AC
  Filled 2014-02-02: qty 2

## 2014-02-02 MED ORDER — METOCLOPRAMIDE HCL 5 MG/ML IJ SOLN: 10.0000 mg | Freq: Three times a day (TID) | INTRAMUSCULAR | Status: DC | PRN

## 2014-02-02 MED ORDER — ONDANSETRON HCL 4 MG PO TABS: 4.0000 mg | ORAL_TABLET | ORAL | Status: DC | PRN

## 2014-02-02 MED ORDER — SIMETHICONE 80 MG PO CHEW
80.0000 mg | CHEWABLE_TABLET | Freq: Three times a day (TID) | ORAL | Status: DC
  Administered 2014-02-03 – 2014-02-04 (×4): 80 mg via ORAL
  Filled 2014-02-02 (×4): qty 1

## 2014-02-02 MED ORDER — DIBUCAINE 1 % RE OINT: 1.0000 "application " | TOPICAL_OINTMENT | RECTAL | Status: DC | PRN

## 2014-02-02 MED ORDER — DIPHENHYDRAMINE HCL 25 MG PO CAPS: 25.0000 mg | ORAL_CAPSULE | Freq: Four times a day (QID) | ORAL | Status: DC | PRN

## 2014-02-02 MED ORDER — TETANUS-DIPHTH-ACELL PERTUSSIS 5-2.5-18.5 LF-MCG/0.5 IM SUSP
0.5000 mL | Freq: Once | INTRAMUSCULAR | Status: AC
  Administered 2014-02-04: 0.5 mL via INTRAMUSCULAR

## 2014-02-02 MED ORDER — ONDANSETRON HCL 4 MG/2ML IJ SOLN
4.0000 mg | Freq: Four times a day (QID) | INTRAMUSCULAR | Status: DC | PRN
  Administered 2014-02-02: 4 mg via INTRAVENOUS
  Filled 2014-02-02: qty 2

## 2014-02-02 MED ORDER — OXYTOCIN 40 UNITS IN LACTATED RINGERS INFUSION - SIMPLE MED: 62.5000 mL/h | INTRAVENOUS | Status: DC

## 2014-02-02 MED ORDER — ACETAMINOPHEN 325 MG PO TABS: 650.0000 mg | ORAL_TABLET | ORAL | Status: DC | PRN

## 2014-02-02 MED ORDER — KETOROLAC TROMETHAMINE 30 MG/ML IJ SOLN: 30.0000 mg | Freq: Four times a day (QID) | INTRAMUSCULAR | Status: AC | PRN

## 2014-02-02 SURGICAL SUPPLY — 30 items
CLAMP CORD UMBIL (MISCELLANEOUS) IMPLANT
CLOTH BEACON ORANGE TIMEOUT ST (SAFETY) ×3 IMPLANT
DERMABOND ADHESIVE PROPEN (GAUZE/BANDAGES/DRESSINGS) ×2
DERMABOND ADVANCED (GAUZE/BANDAGES/DRESSINGS)
DERMABOND ADVANCED .7 DNX12 (GAUZE/BANDAGES/DRESSINGS) IMPLANT
DERMABOND ADVANCED .7 DNX6 (GAUZE/BANDAGES/DRESSINGS) ×1 IMPLANT
DRAPE LG THREE QUARTER DISP (DRAPES) IMPLANT
DRSG OPSITE POSTOP 4X10 (GAUZE/BANDAGES/DRESSINGS) ×3 IMPLANT
DURAPREP 26ML APPLICATOR (WOUND CARE) ×3 IMPLANT
ELECT REM PT RETURN 9FT ADLT (ELECTROSURGICAL) ×3
ELECTRODE REM PT RTRN 9FT ADLT (ELECTROSURGICAL) ×1 IMPLANT
EXTRACTOR VACUUM M CUP 4 TUBE (SUCTIONS) IMPLANT
EXTRACTOR VACUUM M CUP 4' TUBE (SUCTIONS)
GLOVE BIO SURGEON STRL SZ7 (GLOVE) ×3 IMPLANT
GOWN STRL REIN XL XLG (GOWN DISPOSABLE) ×6 IMPLANT
KIT ABG SYR 3ML LUER SLIP (SYRINGE) IMPLANT
NEEDLE HYPO 25X5/8 SAFETYGLIDE (NEEDLE) IMPLANT
NS IRRIG 1000ML POUR BTL (IV SOLUTION) ×3 IMPLANT
PACK C SECTION WH (CUSTOM PROCEDURE TRAY) ×3 IMPLANT
PAD OB MATERNITY 4.3X12.25 (PERSONAL CARE ITEMS) ×3 IMPLANT
RTRCTR C-SECT PINK 25CM LRG (MISCELLANEOUS) ×3 IMPLANT
STAPLER VISISTAT 35W (STAPLE) IMPLANT
SUT CHROMIC 1 CTX 36 (SUTURE) ×9 IMPLANT
SUT PDS AB 0 CTX 60 (SUTURE) ×3 IMPLANT
SUT VIC AB 2-0 CT1 27 (SUTURE) ×4
SUT VIC AB 2-0 CT1 TAPERPNT 27 (SUTURE) ×2 IMPLANT
SUT VIC AB 4-0 KS 27 (SUTURE) ×3 IMPLANT
TOWEL OR 17X24 6PK STRL BLUE (TOWEL DISPOSABLE) ×3 IMPLANT
TRAY FOLEY CATH 14FR (SET/KITS/TRAYS/PACK) ×3 IMPLANT
WATER STERILE IRR 1000ML POUR (IV SOLUTION) ×3 IMPLANT

## 2014-02-02 NOTE — Lactation Note (Signed)
This note was copied from the chart of Bailey Woods Schuler. Lactation Consultation Note Initial consult:  Jan RN doing postpartum check.m  According to Jan RN, LS8, great latch.  Briefly reviewed lactation support services brochure. Will need follow up and education in am.  Patient Name: Bailey Woods Dunlow ZOXWR'UToday's Date: 02/02/2014 Reason for consult: Initial assessment   Maternal Data    Feeding Feeding Type: Breast Fed Length of feed: 20 min  LATCH Score/Interventions Latch: Repeated attempts needed to sustain latch, nipple held in mouth throughout feeding, stimulation needed to elicit sucking reflex. Intervention(s): Skin to skin Intervention(s): Adjust position;Assist with latch;Breast compression  Audible Swallowing: Spontaneous and intermittent Intervention(s): Skin to skin  Type of Nipple: Everted at rest and after stimulation  Comfort (Breast/Nipple): Soft / non-tender     Hold (Positioning): Assistance needed to correctly position infant at breast and maintain latch. Intervention(s): Support Pillows;Breastfeeding basics reviewed;Position options;Skin to skin  LATCH Score: 8  Lactation Tools Discussed/Used     Consult Status Consult Status: Follow-up Date: 02/03/14 Follow-up type: In-patient    Dahlia ByesBerkelhammer, Ogle Hoe[facility]ner [facility] 02/02/2014, 10:09 PM

## 2014-02-02 NOTE — Op Note (Signed)
Pre-Operative Diagnosis: 1) 40 week intrauterine pregnancy 2) History of prior cesarean section, desires repeat Postoperative Diagnosis: Same Procedure: Repeat Cesarean Section Surgeon: Dr. Waynard ReedsKendra Alayshia Marini Assistant: None Operative Findings: Vigorous female infant in the vertex presentation with apgars of 9 & 9. Normal ovaries and tubes bilaterally with a benign paratubal cycst on the right hand side. Weight pending Specimen: Placenta for disposal EBL: Total I/O In: 3200 [I.V.:3200] Out: 1050 [Urine:150; Blood:900]   Procedure:Ms. Dahlia ClientBrowning is an 23 year old gravida 2 para 1001 at 40 weeks and 0 days estimated gestational age who presents for cesarean section. The patient had desired a trial of labor and was admitted this morning for induction. However, after considering her options she decided she would prefer a repeat cesarean section. Following the appropriate informed consent the patient was brought to the operating room where spinal anesthesia was administered and found to be adequate. She was placed in the dorsal supine position with a leftward tilt. She was prepped and draped in the normal sterile fashion. Scalpel was then used to make a Pfannenstiel skin incision which was carried down to the underlying layers of soft tissue to the fascia. The fascia was incised in the midline and the fascial incision was extended laterally with Mayo scissors. The superior aspect of the fascial incision was grasped with Coker clamps x2, tented up and the rectus muscles dissected off sharply with the electrocautery unit area and the same procedure was repeated on the inferior aspect of the fascial incision. The rectus muscles were separated in the midline. The abdominal peritoneum was identified, tented up, entered sharply, and the incision was extended superiorly and inferiorly with good visualization of the bladder. The Alexis retractor was then deployed. The vesicouterine peritoneum was identified, tented up, entered  sharply, and the bladder flap was created digitally. Scalpel was then used to make a low transverse incision on the uterus which was extended laterally with blunt dissection. The fetal vertex was identified, delivered easily through the uterine incision followed by the body. The infant was bulb suctioned on the operative field cried vigorously, cord was clamped and cut and the infant was passed to the waiting neonatologist. Placenta was then delivered spontaneously, the uterus was cleared of all clot and debris. The uterine incision was repaired with #1 chromic in running locked fashion followed by a second imbricating layer. Ovaries and tubes were inspected and normal. The Alexis retractor was removed. The uterus was returned to the abdominal cavity the abdominal cavity was cleared of all clot and debris. The abdominal peritoneum was reapproximated with 2-0 Vicryl in a running fashion, the rectus muscles was reapproximated with #1 chromic in a running fashion. The fascia was closed with a looped PDS in a running fashion. The skin was closed with 4-0 vicryl in a subcuticular fashion and Dermabond. All sponge lap and needle counts were correct x2. Patient tolerated the procedure well and recovered in stable condition following the procedure.

## 2014-02-02 NOTE — Anesthesia Procedure Notes (Signed)
Spinal  Patient location during procedure: OR Start time: 02/02/2014 3:49 PM Staffing Anesthesiologist: Brayton CavesJACKSON, Ryley Bachtel Performed by: anesthesiologist  Preanesthetic Checklist Completed: patient identified, site marked, surgical consent, pre-op evaluation, timeout performed, IV checked, risks and benefits discussed and monitors and equipment checked Spinal Block Patient position: sitting Prep: DuraPrep Patient monitoring: heart rate, cardiac monitor, continuous pulse ox and blood pressure Approach: midline Location: L3-4 Injection technique: single-shot Needle Needle type: Sprotte  Needle gauge: 24 G Needle length: 9 cm Assessment Sensory level: T4 Additional Notes Patient identified.  Risk benefits discussed including failed block, incomplete pain control, headache, nerve damage, paralysis, blood pressure changes, nausea, vomiting, reactions to medication both toxic or allergic, and postpartum back pain.  Patient expressed understanding and wished to proceed.  All questions were answered.  Sterile technique used throughout procedure.  CSF was clear.  No parasthesia or other complications.  Please see nursing notes for vital signs.

## 2014-02-02 NOTE — Transfer of Care (Deleted)
Immediate Anesthesia Transfer of Care Note  Patient: Bailey Woods  Procedure(s) Performed: Procedure(s): CESAREAN SECTION (N/A)  Patient Location: PACU  Anesthesia Type:General  Level of Consciousness: awake, alert  and oriented  Airway & Oxygen Therapy: Patient Spontanous Breathing and Patient connected to face mask oxygen  Post-op Assessment: Report given to PACU RN and Post -op Vital signs reviewed and stable  Post vital signs: Reviewed and stable  Complications: No apparent anesthesia complications

## 2014-02-02 NOTE — Transfer of Care (Signed)
Immediate Anesthesia Transfer of Care Note  Patient: Bailey MillardCourtney M Woods  Procedure(s) Performed: Procedure(s): CESAREAN SECTION (N/A)  Patient Location: PACU  Anesthesia Type:Spinal  Level of Consciousness: awake, alert , oriented and patient cooperative  Airway & Oxygen Therapy: Patient Spontanous Breathing  Post-op Assessment: Report given to PACU RN and Post -op Vital signs reviewed and stable  Post vital signs: Reviewed and stable  Complications: No apparent anesthesia complications

## 2014-02-02 NOTE — H&P (Addendum)
Bailey Woods is a 23 y.o. female presenting for IOL   23 yo G2P1001 @ 40+0 presents for IOL. Pts pregnancy has been uncomplicated. She had an abnormal 1 hr gtt but 3 hr WNL. Pt is Rh negative and received rhogam.  She has a 6917 month old baby and had a cesarean section for CPD. That baby weighed 8#9. Pts current pregnancy is of comparable size. Pt strongly desires TOLAC but understands that she may require repeat cesarean.  She has signed VBAC consent in office.  History OB History   Grav Para Term Preterm Abortions TAB SAB Ect Mult Living   2 1 1  0 0 0 0 0 0 1     Past Medical History  Diagnosis Date  . Medical history non-contributory    Past Surgical History  Procedure Laterality Date  . No past surgeries    . Cesarean section  09/02/2012    Procedure: CESAREAN SECTION;  Surgeon: Mickel Baasichard D Kaplan, MD;  Location: [facility]S;  Service: Obstetrics;  Laterality: N/A;  Primary Cesarean Section Delivery Baby Boy @ 2354, Apgars    Family History: family history includes Asthma in her brother; Cancer in her maternal grandmother; Diabetes in her father and maternal aunt; Heart disease in her maternal grandfather and mother; Hyperlipidemia in her father; Hypertension in her mother; Kidney disease in her father; Stroke in her father. Social History:  reports that she has never smoked. She does not have any smokeless tobacco history on file. She reports that she does not drink alcohol or use illicit drugs.   Prenatal Transfer Tool  Maternal Diabetes: No Genetic Screening: Declined Maternal Ultrasounds/Referrals: Normal Fetal Ultrasounds or other Referrals:  None Maternal Substance Abuse:  No Significant Maternal Medications:  None Significant Maternal Lab Results:  None Other Comments:  None  ROS: as above    Blood pressure 139/83, pulse 104, resp. rate 20, height 5\' 2"  (1.575 m), weight 81.647 kg (180 lb), unknown if currently breastfeeding. Exam Physical Exam  Prenatal labs: ABO,  Rh: A/Negative/-- (08/07 0000) Antibody: Negative (08/07 0000) Rubella: Immune (08/07 0000) RPR: Nonreactive (08/07 0000)  HBsAg: Negative (08/07 0000)  HIV: Non-reactive (08/07 0000)  GBS: Negative (02/16 0000)   Assessment/Plan: 1) Admit 2) AROM/Pitocin 3) Epidural 4) TOLAC Addendum: After considering her options the patient has reconsidered and now desires a repeat cesarean section. She last ate at 6:30.  Will plan for repeat cesarean this after noon. R/B/A reviewed.  Ailin Rochford H. 02/02/2014, 8:26 AM

## 2014-02-02 NOTE — Anesthesia Preprocedure Evaluation (Signed)
Anesthesia Evaluation  Patient identified by MRN, date of birth, ID band Patient awake    Reviewed: Allergy & Precautions, H&P , NPO status , Patient's Chart, lab work & pertinent test results  Airway Mallampati: II       Dental   Pulmonary  breath sounds clear to auscultation        Cardiovascular Exercise Tolerance: Good Rhythm:regular Rate:Normal     Neuro/Psych    GI/Hepatic   Endo/Other    Renal/GU      Musculoskeletal   Abdominal   Peds  Hematology   Anesthesia Other Findings   Reproductive/Obstetrics (+) Pregnancy                             Anesthesia Physical Anesthesia Plan  ASA: II  Anesthesia Plan: Spinal   Post-op Pain Management:    Induction:   Airway Management Planned:   Additional Equipment:   Intra-op Plan:   Post-operative Plan:   Informed Consent: I have reviewed the patients History and Physical, chart, labs and discussed the procedure including the risks, benefits and alternatives for the proposed anesthesia with the patient or authorized representative who has indicated his/her understanding and acceptance.     Plan Discussed with: Anesthesiologist, CRNA and Surgeon  Anesthesia Plan Comments:         Anesthesia Quick Evaluation  

## 2014-02-02 NOTE — Anesthesia Postprocedure Evaluation (Signed)
Anesthesia Post Note  Patient: Bailey MillardCourtney M Woods  Procedure(s) Performed: Procedure(s) (LRB): CESAREAN SECTION (N/A)  Anesthesia type: Spinal  Patient location: PACU  Post pain: Pain level controlled  Post assessment: Post-op Vital signs reviewed  Last Vitals:  Filed Vitals:   02/02/14 1458  BP: 116/72  Pulse: 95  Temp:   Resp: 20    Post vital signs: Reviewed  Level of consciousness: awake  Complications: No apparent anesthesia complications

## 2014-02-03 LAB — CBC
HCT: 28.2 % — ABNORMAL LOW (ref 36.0–46.0)
Hemoglobin: 9.5 g/dL — ABNORMAL LOW (ref 12.0–15.0)
MCH: 32 pg (ref 26.0–34.0)
MCHC: 33.7 g/dL (ref 30.0–36.0)
MCV: 94.9 fL (ref 78.0–100.0)
PLATELETS: 116 10*3/uL — AB (ref 150–400)
RBC: 2.97 MIL/uL — ABNORMAL LOW (ref 3.87–5.11)
RDW: 14.1 % (ref 11.5–15.5)
WBC: 9.7 10*3/uL (ref 4.0–10.5)

## 2014-02-03 NOTE — Lactation Note (Signed)
This note was copied from the chart of Bailey Deetta PerlaCourtney Speranza. Lactation Consultation Note  Patient Name: Bailey Woods BJYNW'GToday's Date: 02/03/2014 Reason for consult: Initial assessment (encouraged to page for latch aessessment )Baby is 21 hours old and presently baby is being held by grandmother while mom eats lunch . Per mom baby fed last around 1245 and fed well . Also has been to the breast several times since birth, but doesn't get on as deep has he needs to . LC encouraged to call LC when baby is showing feeding cues for assistance with depth at the breast . per mom had challenges and ended up pumping and bottlefeeding , Per baby is breast feeding well , but I will probably end up pumping and bottlefeeding so I can see how much the baby is taking, I like that reassurance. Mom aware [facility] and [facility].   Maternal Data Formula Feeding for Exclusion: No Infant to breast within first hour of birth: Yes (attempted , sleepy per 1st feeding record ) Does the patient have breastfeeding experience prior to this delivery?: Yes  Feeding Feeding Type:  (per mom last fed around 1245 , sleepy at present ) Length of feed: 0 min  LATCH Score/Interventions                Intervention(s): Breastfeeding basics reviewed     Lactation Tools Discussed/Used Tools:  (per mom has a DEBP Medela at home ) [facility] )   Consult Status Consult Status: Follow-up (to page for feeding assessment ) Date: 02/03/14 Follow-up type: In-patient    Kathrin Greathouseorio, Riot Barrick Ann 02/03/2014, 1:37 PM

## 2014-02-03 NOTE — Progress Notes (Signed)
Late entry from 10am: Patient is eating, ambulating, foley in place at time of exam.  Pain control is good.  Appropriate lochia.  No complaints.  Filed Vitals:   02/03/14 0300 02/03/14 0445 02/03/14 0840 02/03/14 1200  BP: 127/89 110/69 108/62 116/71  Pulse: 94 76 79 86  Temp: 98.4 F (36.9 C) 98.4 F (36.9 C) 98.4 F (36.9 C) 98.3 F (36.8 C)  TempSrc:   Oral Oral  Resp: 18 18 16 18   Height:      Weight:      SpO2:  95% 96% 95%    [employer]: c/d/i Ext: no CT  Lab Results  Component Value Date   WBC 9.7 02/03/2014   HGB 9.5* 02/03/2014   HCT 28.2* 02/03/2014   MCV 94.9 02/03/2014   PLT 116* 02/03/2014    --/--/A NEG (03/16 0800)  A/P Post op day #1 R C/S. Doing well. Desires circ, consent obtained, later advised by RN that due to BS instability, peds requests delay of circ until 3/18.  Routine care.    Philip AspenALLAHAN, Altie Savard

## 2014-02-03 NOTE — Anesthesia Postprocedure Evaluation (Signed)
  Anesthesia Post-op Note  Patient: Bailey Woods  Procedure(s) Performed: Procedure(s): CESAREAN SECTION (N/A)  Patient Location: Mother/Baby  Anesthesia Type:Spinal  Level of Consciousness: awake, alert  and oriented  Airway and Oxygen Therapy: Patient Spontanous Breathing  Post-op Pain: none  Post-op Assessment: Post-op Vital signs reviewed, Patient's Cardiovascular Status Stable, Respiratory Function Stable, No headache, No backache, No residual numbness and No residual motor weakness  Post-op Vital Signs: Reviewed and stable  Complications: No apparent anesthesia complications

## 2014-02-04 ENCOUNTER — Encounter (HOSPITAL_COMMUNITY): Payer: Self-pay | Admitting: Obstetrics and Gynecology

## 2014-02-04 MED ORDER — OXYCODONE-ACETAMINOPHEN 5-325 MG PO TABS: 1.0000 | ORAL_TABLET | ORAL | Status: DC | PRN

## 2014-02-04 NOTE — Progress Notes (Signed)
  Patient is eating, ambulating, voiding.  Pain control is good.  Filed Vitals:   02/03/14 0840 02/03/14 1200 02/03/14 1625 02/04/14 0618  BP: 108/62 116/71 121/74 110/75  Pulse: 79 86 94 83  Temp: 98.4 F (36.9 C) 98.3 F (36.8 C) 98.3 F (36.8 C) 97.6 F (36.4 C)  TempSrc: Oral Oral Oral Oral  Resp: 16 18 20 18   Height:      Weight:      SpO2: 96% 95% 95%     lungs:   clear to auscultation cor:    RRR Abdomen:  soft, appropriate tenderness, incisions intact and without erythema or exudate ex:    no cords   Lab Results  Component Value Date   WBC 9.7 02/03/2014   HGB 9.5* 02/03/2014   HCT 28.2* 02/03/2014   MCV 94.9 02/03/2014   PLT 116* 02/03/2014    --/--/A NEG (03/16 0800)/RI  A/P    Post operative day 2.  Routine post op and postpartum care.  Expect d/c per routine.  Percocet for pain control.  Parents desires circumsision.  All risks, benefits and alternatives discussed with the  mother.  Baby is A neg- no Rhogam.

## 2014-02-04 NOTE — Discharge Summary (Signed)
Obstetric Discharge Summary Reason for Admission: induction of labor Prenatal Procedures: none Intrapartum Procedures: cesarean: low cervical, transverse-repeat Postpartum Procedures: none Complications-Operative and Postpartum: none Hemoglobin  Date Value Ref Range Status  02/03/2014 9.5* 12.0 - 15.0 g/dL Final     REPEATED TO VERIFY     DELTA CHECK NOTED     HCT  Date Value Ref Range Status  02/03/2014 28.2* 36.0 - 46.0 % Final    Discharge Diagnoses: Term Pregnancy-delivered  Discharge Information: Date: 02/04/2014 Activity: pelvic rest Diet: routine Medications: Iron and Percocet Condition: stable Instructions: refer to practice specific booklet Discharge to: home Follow-up Information   Follow up with Almon HerculesOSS,KENDRA H., MD In 4 weeks.   Specialty:  Obstetrics and Gynecology   Contact information:   [address] [place] [address] [phone]       Newborn Data: Live born female  Birth Weight: 9 lb 3.1 oz (4170 g) APGAR: 9, 9  Home with mother.  Michalene Debruler A 02/04/2014, 9:57 AM

## 2014-09-21 ENCOUNTER — Encounter (HOSPITAL_COMMUNITY): Payer: Self-pay | Admitting: Obstetrics and Gynecology

## 2016-01-20 ENCOUNTER — Other Ambulatory Visit: Payer: Self-pay | Admitting: Obstetrics and Gynecology

## 2016-02-17 ENCOUNTER — Other Ambulatory Visit (HOSPITAL_COMMUNITY): Payer: Self-pay | Admitting: Obstetrics and Gynecology

## 2016-02-17 ENCOUNTER — Encounter (HOSPITAL_COMMUNITY): Payer: Self-pay | Admitting: Obstetrics and Gynecology

## 2016-02-17 DIAGNOSIS — Z3689 Encounter for other specified antenatal screening: Secondary | ICD-10-CM

## 2016-02-17 DIAGNOSIS — Z3A16 16 weeks gestation of pregnancy: Secondary | ICD-10-CM

## 2016-02-17 DIAGNOSIS — O359XX1 Maternal care for (suspected) fetal abnormality and damage, unspecified, fetus 1: Secondary | ICD-10-CM

## 2016-02-24 ENCOUNTER — Ambulatory Visit (HOSPITAL_COMMUNITY)
Admission: RE | Admit: 2016-02-24 | Discharge: 2016-02-24 | Disposition: A | Discharge status: Home / Self Care | Payer: BLUE CROSS/BLUE SHIELD | Source: Ambulatory Visit | Attending: Obstetrics and Gynecology | Admitting: Obstetrics and Gynecology

## 2016-02-24 ENCOUNTER — Other Ambulatory Visit (HOSPITAL_COMMUNITY): Payer: Self-pay | Admitting: Obstetrics and Gynecology

## 2016-02-24 ENCOUNTER — Encounter (HOSPITAL_COMMUNITY): Payer: Self-pay

## 2016-02-24 ENCOUNTER — Ambulatory Visit (HOSPITAL_COMMUNITY): Admission: RE | Admit: 2016-02-24 | Payer: BLUE CROSS/BLUE SHIELD | Source: Ambulatory Visit

## 2016-02-24 DIAGNOSIS — O35DXX0 Maternal care for other (suspected) fetal abnormality and damage, fetal gastrointestinal anomalies, not applicable or unspecified: Secondary | ICD-10-CM

## 2016-02-24 DIAGNOSIS — O358XX0 Maternal care for other (suspected) fetal abnormality and damage, not applicable or unspecified: Secondary | ICD-10-CM | POA: Insufficient documentation

## 2016-02-24 DIAGNOSIS — O359XX1 Maternal care for (suspected) fetal abnormality and damage, unspecified, fetus 1: Secondary | ICD-10-CM

## 2016-02-24 DIAGNOSIS — O34219 Maternal care for unspecified type scar from previous cesarean delivery: Secondary | ICD-10-CM | POA: Diagnosis not present

## 2016-02-24 DIAGNOSIS — Z3A16 16 weeks gestation of pregnancy: Secondary | ICD-10-CM | POA: Diagnosis not present

## 2016-02-24 DIAGNOSIS — Z36 Encounter for antenatal screening of mother: Secondary | ICD-10-CM | POA: Diagnosis not present

## 2016-02-24 DIAGNOSIS — Z3689 Encounter for other specified antenatal screening: Secondary | ICD-10-CM

## 2016-03-02 ENCOUNTER — Other Ambulatory Visit (HOSPITAL_COMMUNITY): Payer: Self-pay

## 2016-03-08 ENCOUNTER — Other Ambulatory Visit (HOSPITAL_COMMUNITY): Payer: Self-pay

## 2016-03-23 ENCOUNTER — Encounter (HOSPITAL_COMMUNITY): Payer: Self-pay

## 2016-03-23 ENCOUNTER — Ambulatory Visit (HOSPITAL_COMMUNITY)
Admission: RE | Admit: 2016-03-23 | Discharge: 2016-03-23 | Disposition: A | Discharge status: Home / Self Care | Payer: BLUE CROSS/BLUE SHIELD | Source: Ambulatory Visit | Attending: Obstetrics and Gynecology | Admitting: Obstetrics and Gynecology

## 2016-03-23 ENCOUNTER — Other Ambulatory Visit (HOSPITAL_COMMUNITY): Payer: Self-pay

## 2016-03-23 ENCOUNTER — Other Ambulatory Visit (HOSPITAL_COMMUNITY): Payer: Self-pay | Admitting: Maternal and Fetal Medicine

## 2016-03-23 DIAGNOSIS — Z3A20 20 weeks gestation of pregnancy: Secondary | ICD-10-CM

## 2016-03-23 DIAGNOSIS — Z36 Encounter for antenatal screening of mother: Secondary | ICD-10-CM | POA: Insufficient documentation

## 2016-03-23 DIAGNOSIS — Z0489 Encounter for examination and observation for other specified reasons: Secondary | ICD-10-CM

## 2016-03-23 DIAGNOSIS — O35DXX0 Maternal care for other (suspected) fetal abnormality and damage, fetal gastrointestinal anomalies, not applicable or unspecified: Secondary | ICD-10-CM

## 2016-03-23 DIAGNOSIS — IMO0002 Reserved for concepts with insufficient information to code with codable children: Secondary | ICD-10-CM

## 2016-03-23 DIAGNOSIS — O358XX0 Maternal care for other (suspected) fetal abnormality and damage, not applicable or unspecified: Secondary | ICD-10-CM | POA: Diagnosis present

## 2016-03-23 DIAGNOSIS — O34212 Maternal care for vertical scar from previous cesarean delivery: Secondary | ICD-10-CM | POA: Insufficient documentation

## 2016-03-23 DIAGNOSIS — O34219 Maternal care for unspecified type scar from previous cesarean delivery: Secondary | ICD-10-CM

## 2016-03-23 DIAGNOSIS — O359XX0 Maternal care for (suspected) fetal abnormality and damage, unspecified, not applicable or unspecified: Secondary | ICD-10-CM

## 2016-04-12 ENCOUNTER — Observation Stay (HOSPITAL_COMMUNITY)
Admission: AD | Admit: 2016-04-12 | Discharge: 2016-04-13 | Disposition: A | Discharge status: Home / Self Care | Payer: BLUE CROSS/BLUE SHIELD | Source: Ambulatory Visit | Attending: Obstetrics and Gynecology | Admitting: Obstetrics and Gynecology

## 2016-04-12 ENCOUNTER — Encounter (HOSPITAL_COMMUNITY): Payer: Self-pay

## 2016-04-12 ENCOUNTER — Inpatient Hospital Stay (HOSPITAL_COMMUNITY): Payer: BLUE CROSS/BLUE SHIELD

## 2016-04-12 DIAGNOSIS — N1330 Unspecified hydronephrosis: Secondary | ICD-10-CM

## 2016-04-12 DIAGNOSIS — O26833 Pregnancy related renal disease, third trimester: Principal | ICD-10-CM | POA: Insufficient documentation

## 2016-04-12 DIAGNOSIS — Z3A23 23 weeks gestation of pregnancy: Secondary | ICD-10-CM

## 2016-04-12 DIAGNOSIS — N200 Calculus of kidney: Secondary | ICD-10-CM | POA: Insufficient documentation

## 2016-04-12 DIAGNOSIS — O26899 Other specified pregnancy related conditions, unspecified trimester: Secondary | ICD-10-CM

## 2016-04-12 DIAGNOSIS — R10A0 Flank pain, unspecified side: Secondary | ICD-10-CM

## 2016-04-12 DIAGNOSIS — R109 Unspecified abdominal pain: Secondary | ICD-10-CM

## 2016-04-12 LAB — COMPREHENSIVE METABOLIC PANEL
ALBUMIN: 3.8 g/dL (ref 3.5–5.0)
ALK PHOS: 35 U/L — AB (ref 38–126)
ALT: 11 U/L — AB (ref 14–54)
ANION GAP: 11 (ref 5–15)
AST: 18 U/L (ref 15–41)
BILIRUBIN TOTAL: 0.6 mg/dL (ref 0.3–1.2)
BUN: 17 mg/dL (ref 6–20)
CALCIUM: 10.4 mg/dL — AB (ref 8.9–10.3)
CO2: 23 mmol/L (ref 22–32)
CREATININE: 0.7 mg/dL (ref 0.44–1.00)
Chloride: 103 mmol/L (ref 101–111)
GFR calc Af Amer: >60 mL/min (ref >60)
GFR calc non Af Amer: >60 mL/min (ref >60)
GLUCOSE: 97 mg/dL (ref 65–99)
Potassium: 3.5 mmol/L (ref 3.5–5.1)
Sodium: 137 mmol/L (ref 135–145)
TOTAL PROTEIN: 7.3 g/dL (ref 6.5–8.1)

## 2016-04-12 LAB — CBC
HEMATOCRIT: 33 % — AB (ref 36.0–46.0)
HEMOGLOBIN: 11.5 g/dL — AB (ref 12.0–15.0)
MCH: 32 pg (ref 26.0–34.0)
MCHC: 34.8 g/dL (ref 30.0–36.0)
MCV: 91.9 fL (ref 78.0–100.0)
Platelets: 169 10*3/uL (ref 150–400)
RBC: 3.59 MIL/uL — AB (ref 3.87–5.11)
RDW: 13.7 % (ref 11.5–15.5)
WBC: 8.9 10*3/uL (ref 4.0–10.5)

## 2016-04-12 LAB — URINE MICROSCOPIC-ADD ON

## 2016-04-12 LAB — TYPE AND SCREEN
ABO/RH(D): A NEG
ANTIBODY SCREEN: NEGATIVE

## 2016-04-12 LAB — URINALYSIS, ROUTINE W REFLEX MICROSCOPIC
BILIRUBIN URINE: NEGATIVE
GLUCOSE, UA: NEGATIVE mg/dL
KETONES UR: NEGATIVE mg/dL
Leukocytes, UA: NEGATIVE
Nitrite: NEGATIVE
PH: 6.5 (ref 5.0–8.0)
PROTEIN: NEGATIVE mg/dL
Specific Gravity, Urine: <1.005 — ABNORMAL LOW (ref 1.005–1.030)

## 2016-04-12 MED ORDER — LACTATED RINGERS IV BOLUS (SEPSIS)
1000.0000 mL | Freq: Once | INTRAVENOUS | Status: AC
  Administered 2016-04-12: 1000 mL via INTRAVENOUS

## 2016-04-12 MED ORDER — PROMETHAZINE HCL 25 MG/ML IJ SOLN
25.0000 mg | Freq: Once | INTRAVENOUS | Status: AC
  Administered 2016-04-12: 25 mg via INTRAVENOUS
  Filled 2016-04-12: qty 1

## 2016-04-12 MED ORDER — ACETAMINOPHEN 325 MG PO TABS
650.0000 mg | ORAL_TABLET | ORAL | Status: DC | PRN
  Administered 2016-04-13: 650 mg via ORAL
  Filled 2016-04-12: qty 2

## 2016-04-12 MED ORDER — HYDROMORPHONE 1 MG/ML IV SOLN
INTRAVENOUS | Status: DC
  Administered 2016-04-12: 2.4 mL via INTRAVENOUS
  Administered 2016-04-12: 07:00:00 via INTRAVENOUS
  Filled 2016-04-12: qty 25

## 2016-04-12 MED ORDER — LACTATED RINGERS IV SOLN
INTRAVENOUS | Status: DC
  Administered 2016-04-12 – 2016-04-13 (×3): via INTRAVENOUS

## 2016-04-12 MED ORDER — DIPHENHYDRAMINE HCL 50 MG/ML IJ SOLN: 12.5000 mg | Freq: Four times a day (QID) | INTRAMUSCULAR | Status: DC | PRN

## 2016-04-12 MED ORDER — OXYCODONE HCL 5 MG PO TABS
5.0000 mg | ORAL_TABLET | ORAL | Status: DC | PRN
  Administered 2016-04-12: 5 mg via ORAL
  Filled 2016-04-12: qty 1

## 2016-04-12 MED ORDER — ONDANSETRON HCL 4 MG/2ML IJ SOLN
4.0000 mg | Freq: Once | INTRAMUSCULAR | Status: AC
  Administered 2016-04-12: 4 mg via INTRAVENOUS
  Filled 2016-04-12: qty 2

## 2016-04-12 MED ORDER — TAMSULOSIN HCL 0.4 MG PO CAPS
0.4000 mg | ORAL_CAPSULE | Freq: Every day | ORAL | Status: DC
  Administered 2016-04-13: 0.4 mg via ORAL
  Filled 2016-04-12 (×3): qty 1

## 2016-04-12 MED ORDER — CALCIUM CARBONATE ANTACID 500 MG PO CHEW: 2.0000 | CHEWABLE_TABLET | ORAL | Status: DC | PRN

## 2016-04-12 MED ORDER — NALOXONE HCL 0.4 MG/ML IJ SOLN: 0.4000 mg | INTRAMUSCULAR | Status: DC | PRN

## 2016-04-12 MED ORDER — SODIUM CHLORIDE 0.9% FLUSH: 9.0000 mL | INTRAVENOUS | Status: DC | PRN

## 2016-04-12 MED ORDER — DOCUSATE SODIUM 100 MG PO CAPS
100.0000 mg | ORAL_CAPSULE | Freq: Every day | ORAL | Status: DC
  Administered 2016-04-12 – 2016-04-13 (×2): 100 mg via ORAL
  Filled 2016-04-12 (×2): qty 1

## 2016-04-12 MED ORDER — HYDROMORPHONE HCL 1 MG/ML IJ SOLN
1.0000 mg | Freq: Once | INTRAMUSCULAR | Status: AC
  Administered 2016-04-12: 1 mg via INTRAVENOUS
  Filled 2016-04-12: qty 1

## 2016-04-12 MED ORDER — PRENATAL MULTIVITAMIN CH
1.0000 | ORAL_TABLET | Freq: Every day | ORAL | Status: DC
  Administered 2016-04-12: 1 via ORAL
  Filled 2016-04-12: qty 1

## 2016-04-12 MED ORDER — ONDANSETRON HCL 4 MG/2ML IJ SOLN: 4.0000 mg | Freq: Four times a day (QID) | INTRAMUSCULAR | Status: DC | PRN

## 2016-04-12 MED ORDER — ZOLPIDEM TARTRATE 5 MG PO TABS: 5.0000 mg | ORAL_TABLET | Freq: Every evening | ORAL | Status: DC | PRN

## 2016-04-12 MED ORDER — DIPHENHYDRAMINE HCL 12.5 MG/5ML PO ELIX: 12.5000 mg | ORAL_SOLUTION | Freq: Four times a day (QID) | ORAL | Status: DC | PRN

## 2016-04-12 NOTE — Progress Notes (Signed)
Name: Bailey Woods Medical Record Number:  [id number] Date of Birth: 05/20/1991 Date of Service: 04/12/2016  25 y.o. G3P2002 [id number] HD# admitted for 6123 WEEKS SEVERE LEFT SIDE PAIN.  Admitted with diagnosis of left nephrolithiasis.  Pt currently stable, she notes that her pain is much improved with PCA.  She denies contractions, no vaginal bleeding, no leaking of fluid. Reports good FM.  The patient's past medical history and prenatal records were reviewed.  Additional issues addressed and updated today: Patient Active Problem List   Diagnosis Date Noted  . Nephrolithiasis 04/12/2016  . Normal labor 02/02/2014  . Cesarean delivery delivered 02/02/2014   Family History  Problem Relation Age of Onset  . Heart disease Mother   . Hypertension Mother   . Stroke Father   . Kidney disease Father   . Diabetes Father   . Hyperlipidemia Father   . Asthma Brother   . Diabetes Maternal Aunt   . Cancer Maternal Grandmother   . Heart disease Maternal Grandfather    Social History   Social History  . Marital Status: Married    Spouse Name: N/A  . Number of Children: N/A  . Years of Education: N/A   Social History Main Topics  . Smoking status: Never Smoker   . Smokeless tobacco: None  . Alcohol Use: No  . Drug Use: No  . Sexual Activity: Yes   Other Topics Concern  . None   Social History Narrative   Filed Vitals:   04/12/16 1058 04/12/16 1102  BP: 109/67   Pulse: 73   Temp: 98 F (36.7 C)   Resp: 13 16     Physical Examination:   Filed Vitals:   04/12/16 1058 04/12/16 1102  BP: 109/67   Pulse: 73   Temp: 98 F (36.7 C)   Resp: 13 16   General appearance - alert, well appearing, and in no distress and oriented to person, place, and time Mental status - alert, oriented to person, place, and time, normal mood, behavior, speech, dress, motor activity, and thought processes Abd  Soft, gravid, nontender Left side: mildly tender, no CVA tenderness Ex SCDs FHTs   150s,  Toco  none  Cervix: not evaluated  Results for orders placed or performed during the hospital encounter of 04/12/16 (from the past 24 hour(s))  CBC     Status: Abnormal   Collection Time: 04/12/16  3:20 AM  Result Value Ref Range   WBC 8.9 4.0 - 10.5 K/uL   RBC 3.59 (L) 3.87 - 5.11 MIL/uL   Hemoglobin 11.5 (L) 12.0 - 15.0 g/dL   HCT 08.633.0 (L) 57.836.0 - 46.946.0 %   MCV 91.9 78.0 - 100.0 fL   MCH 32.0 26.0 - 34.0 pg   MCHC 34.8 30.0 - 36.0 g/dL   RDW 62.913.7 52.811.5 - 41.315.5 %   Platelets 169 150 - 400 K/uL  Comprehensive metabolic panel     Status: Abnormal   Collection Time: 04/12/16  3:20 AM  Result Value Ref Range   Sodium 137 135 - 145 mmol/L   Potassium 3.5 3.5 - 5.1 mmol/L   Chloride 103 101 - 111 mmol/L   CO2 23 22 - 32 mmol/L   Glucose, Bld 97 65 - 99 mg/dL   BUN 17 6 - 20 mg/dL   Creatinine, Ser 2.440.70 0.44 - 1.00 mg/dL   Calcium 01.010.4 (H) 8.9 - 10.3 mg/dL   Total Protein 7.3 6.5 - 8.1 g/dL   Albumin 3.8 3.5 - 5.0  g/dL   AST 18 15 - 41 U/L   ALT 11 (L) 14 - 54 U/L   Alkaline Phosphatase 35 (L) 38 - 126 U/L   Total Bilirubin 0.6 0.3 - 1.2 mg/dL   GFR calc non Af Amer >60 >60 mL/min   GFR calc Af Amer >60 >60 mL/min   Anion gap 11 5 - 15  Type and screen [facility]     Status: None   Collection Time: 04/12/16  3:20 AM  Result Value Ref Range   ABO/RH(D) A NEG    Antibody Screen NEG    Sample Expiration 04/15/2016   Urinalysis, Routine w reflex microscopic (not at [facility])     Status: Abnormal   Collection Time: 04/12/16  3:55 AM  Result Value Ref Range   Color, Urine YELLOW YELLOW   APPearance CLEAR CLEAR   Specific Gravity, Urine <1.005 (L) 1.005 - 1.030   pH 6.5 5.0 - 8.0   Glucose, UA NEGATIVE NEGATIVE mg/dL   Hgb urine dipstick SMALL (A) NEGATIVE   Bilirubin Urine NEGATIVE NEGATIVE   Ketones, ur NEGATIVE NEGATIVE mg/dL   Protein, ur NEGATIVE NEGATIVE mg/dL   Nitrite NEGATIVE NEGATIVE   Leukocytes, UA NEGATIVE NEGATIVE  Urine microscopic-add  on     Status: Abnormal   Collection Time: 04/12/16  3:55 AM  Result Value Ref Range   Squamous Epithelial / LPF 0-5 (A) NONE SEEN   WBC, UA 0-5 0 - 5 WBC/hpf   RBC / HPF 0-5 0 - 5 RBC/hpf   Bacteria, UA RARE (A) NONE SEEN   Urine-Other MUCOUS PRESENT     A:  HD#  [redacted]w[redacted]d with left nephrolithiasis, hydronephrosis and partial ureteral obstruction.  P: -Patient straining urine, will continue.  -Urinalysis small Hgb, will send urine cx in case clinical pic is complicated by UTI -Urology consulted in case stent needed.  -Will continue dilaudid PCA for now -Continue Tamsulosin   Pericles Carmicheal STACIA

## 2016-04-12 NOTE — MAU Note (Signed)
PT presents complaining of pain on the left side of her abdomen and back that woke her up about an hour ago. Denies leaking or bleeding. No urinary symptoms

## 2016-04-12 NOTE — Consult Note (Signed)
Urology Consult   Physician requesting consult: Dr. Tenny Crawoss  Reason for consult: Left hydronephrosis  History of Present Illness: Bailey Woods is a 25 y.o. female who is [redacted] weeks pregnant with her 3rd child.  She was otherwise doing well until last night when she was awaken from sleep with the acute onset of severe left sided flank pain.  This was associated with nausea and vomiting.  Her pain radiated to her left lower quadrant and left groin.  She had denied hematuria, urgency, or urinary frequency. She has no history of recurrent UTIs.  She has no history of kidney stones.  Her father has had kidney stones.  She underwent a renal ultrasound early this morning in the emergency department.  This demonstrated left-sided hydronephrosis with dilation of the proximal ureter.  The ureteral jet was minimal on the left side compared to the right.  Currently, her pain is well-controlled with IV Dilaudid.  She has been placed on tamsulosin for medical expulsion therapy.    Past Medical History  Diagnosis Date  . Medical history non-contributory     Past Surgical History  Procedure Laterality Date  . Cesarean section  09/02/2012    Procedure: CESAREAN SECTION;  Surgeon: Mickel Baasichard D Kaplan, MD;  Location: [facility];  Service: Obstetrics;  Laterality: N/A;  Primary Cesarean Section Delivery Baby Boy @ 2354, Apgars   . Cesarean section N/A 02/02/2014    Procedure: CESAREAN SECTION;  Surgeon: Freddrick MarchKendra H. Tenny Crawoss, MD;  Location: [facility];  Service: Obstetrics;  Laterality: N/A;     Current Hospital Medications:  Home meds:    Medication List    ASK your doctor about these medications        calcium carbonate 500 MG chewable tablet  Commonly known as:  TUMS - dosed in mg elemental calcium  Chew 1 tablet by mouth as needed for indigestion. Reported on 03/23/2016     prenatal multivitamin Tabs tablet  Take 1 tablet by mouth daily.     ranitidine 150 MG tablet  Commonly known as:  ZANTAC  Take 150  mg by mouth 2 (two) times daily as needed for heartburn. Reported on 03/23/2016     TYLENOL 325 MG tablet  Generic drug:  acetaminophen  Take 650 mg by mouth every 6 (six) hours as needed.        Scheduled Meds: . docusate sodium  100 mg Oral Daily  . HYDROmorphone   Intravenous Q4H  . prenatal multivitamin  1 tablet Oral Q1200  . tamsulosin  0.4 mg Oral Daily   Continuous Infusions: . lactated ringers 125 mL/hr at 04/12/16 0627   PRN Meds:.acetaminophen, calcium carbonate, diphenhydrAMINE **OR** diphenhydrAMINE, naloxone **AND** sodium chloride flush, ondansetron (ZOFRAN) IV, zolpidem  Allergies: No Known Allergies  Family History  Problem Relation Age of Onset  . Heart disease Mother   . Hypertension Mother   . Stroke Father   . Kidney disease Father   . Diabetes Father   . Hyperlipidemia Father   . Asthma Brother   . Diabetes Maternal Aunt   . Cancer Maternal Grandmother   . Heart disease Maternal Grandfather     Social History:  reports that she has never smoked. She does not have any smokeless tobacco history on file. She reports that she does not drink alcohol or use illicit drugs.  ROS: A complete review of systems was performed.  All systems are negative except for pertinent findings as noted.  Physical Exam:  Vital signs in last  24 hours: Temp:  [97.3 F (36.3 C)-98.5 F (36.9 C)] 98 F (36.7 C) (05/24 1058) Pulse Rate:  [73-90] 73 (05/24 1058) Resp:  [11-18] 16 (05/24 1102) BP: (94-124)/(54-78) 109/67 mmHg (05/24 1058) SpO2:  [100 %] 100 % (05/24 1058) Constitutional:  Alert and oriented, No acute distress Cardiovascular: Regular rate and rhythm, No JVD Respiratory: Normal respiratory effort, Lungs clear bilaterally GI: Abdomen is gravid but soft, nontender, nondistended, no abdominal masses GU: No CVA tenderness Lymphatic: No lymphadenopathy Neurologic: Grossly intact, no focal deficits Psychiatric: Normal mood and affect  Laboratory Data:    Recent Labs  04/12/16 0320  WBC 8.9  HGB 11.5*  HCT 33.0*  PLT 169     Recent Labs  04/12/16 0320  NA 137  K 3.5  CL 103  GLUCOSE 97  BUN 17  CALCIUM 10.4*  CREATININE 0.70     Results for orders placed or performed during the hospital encounter of 04/12/16 (from the past 24 hour(s))  CBC     Status: Abnormal   Collection Time: 04/12/16  3:20 AM  Result Value Ref Range   WBC 8.9 4.0 - 10.5 K/uL   RBC 3.59 (L) 3.87 - 5.11 MIL/uL   Hemoglobin 11.5 (L) 12.0 - 15.0 g/dL   HCT 78.4 (L) 69.6 - 29.5 %   MCV 91.9 78.0 - 100.0 fL   MCH 32.0 26.0 - 34.0 pg   MCHC 34.8 30.0 - 36.0 g/dL   RDW 28.4 13.2 - 44.0 %   Platelets 169 150 - 400 K/uL  Comprehensive metabolic panel     Status: Abnormal   Collection Time: 04/12/16  3:20 AM  Result Value Ref Range   Sodium 137 135 - 145 mmol/L   Potassium 3.5 3.5 - 5.1 mmol/L   Chloride 103 101 - 111 mmol/L   CO2 23 22 - 32 mmol/L   Glucose, Bld 97 65 - 99 mg/dL   BUN 17 6 - 20 mg/dL   Creatinine, Ser 1.02 0.44 - 1.00 mg/dL   Calcium 72.5 (H) 8.9 - 10.3 mg/dL   Total Protein 7.3 6.5 - 8.1 g/dL   Albumin 3.8 3.5 - 5.0 g/dL   AST 18 15 - 41 U/L   ALT 11 (L) 14 - 54 U/L   Alkaline Phosphatase 35 (L) 38 - 126 U/L   Total Bilirubin 0.6 0.3 - 1.2 mg/dL   GFR calc non Af Amer >60 >60 mL/min   GFR calc Af Amer >60 >60 mL/min   Anion gap 11 5 - 15  Type and screen [facility] [place]     Status: None   Collection Time: 04/12/16  3:20 AM  Result Value Ref Range   ABO/RH(D) A NEG    Antibody Screen NEG    Sample Expiration 04/15/2016   Urinalysis, Routine w reflex microscopic (not at [facility])     Status: Abnormal   Collection Time: 04/12/16  3:55 AM  Result Value Ref Range   Color, Urine YELLOW YELLOW   APPearance CLEAR CLEAR   Specific Gravity, Urine <1.005 (L) 1.005 - 1.030   pH 6.5 5.0 - 8.0   Glucose, UA NEGATIVE NEGATIVE mg/dL   Hgb urine dipstick SMALL (A) NEGATIVE   Bilirubin Urine NEGATIVE NEGATIVE   Ketones,  ur NEGATIVE NEGATIVE mg/dL   Protein, ur NEGATIVE NEGATIVE mg/dL   Nitrite NEGATIVE NEGATIVE   Leukocytes, UA NEGATIVE NEGATIVE  Urine microscopic-add on     Status: Abnormal   Collection Time: 04/12/16  3:55  AM  Result Value Ref Range   Squamous Epithelial / LPF 0-5 (A) NONE SEEN   WBC, UA 0-5 0 - 5 WBC/hpf   RBC / HPF 0-5 0 - 5 RBC/hpf   Bacteria, UA RARE (A) NONE SEEN   Urine-Other MUCOUS PRESENT    No results found for this or any previous visit (from the past 240 hour(s)).  Renal Function:  Recent Labs  04/12/16 0320  CREATININE 0.70   CrCl cannot be calculated (Unknown ideal weight.).  Radiologic Imaging: US Renal  04/12/2016  CLINICAL DATA:  Pregnant patient in second trimester pregnancy with sudden onset of left flank pain. EXAM: RENAL / URINARY TRACT ULTRASOUND COMPLETE COMPARISON:  None. FINDINGS: Right Kidney: Length: 10.9 cm. Renal pyramids appear echogenic. No mass or hydronephrosis visualized. No shadowing stone. Left Kidney: Length: 11.8 cm. Mild left hydronephrosis and dilatation of the proximal ureter. Renal pyramids appear echogenic. Bladder: Appears normal for degree of bladder distention. The right ureteral jet is seen. Faint left ureteral jet is present. IMPRESSION: 1. Mild left hydronephrosis and proximal hydroureter. Left ureteral jet is only faintly visualized. Findings are suggestive of at least partially obstructing stone in the ureter. 2. Echogenic renal pyramids, a nonspecific finding. This can be seen in the setting of hypercalcemia, drug induced, hyperparathyroidism, nephrocalcinosis or other metabolic disorders. Electronically Signed   By: Rubye Oaks M.D.   On: 04/12/2016 04:15   [place] [facility] Ob Limited  04/12/2016  OBSTETRICAL ULTRASOUND: This exam was performed within a [place] [facility]. The OB US report was generated in the [facility] system, and faxed to the ordering physician.  This report is available in the [employer]. See the [facility] Obstetric  US report via the Image Link.   I independently reviewed the above imaging studies.  Impression/Recommendation: Left hydronephrosis and flank pain:  Her symptoms are likely related to a probable left ureteral calculus despite the inability to make a definitive diagnosis.  I would recommend an initial approach of pain control and straining of her urine as most stones will pass spontaneously.  I would attempt to see if her pain can be controlled with oral pain medication over the next 24 hours, and if so, she can continue conservative management at home as an outpatient.  If he pain is not well controlled or if she develops fever/persistent vomiting, etc, will consider proceeding with further imaging and possible intervention if necessary.  I will follow up tomorrow to reassess her.  Eudelia Hiltunen,LES 04/12/2016, 12:34 PM  Moody Bruins. MD   CC: Dr. Tenny Craw

## 2016-04-12 NOTE — MAU Provider Note (Signed)
History     CSN: [id number]  Arrival date and time: 04/12/16 7829   First Provider Initiated Contact with Patient 04/12/16 0302      Chief Complaint  Patient presents with  . Abdominal Pain  . Flank Pain   HPI Bailey Woods is a 25 y.o. G3P2002 at [redacted]w[redacted]d who presents with left flank/abdominal pain. Symptoms woke her up from sleep about an hour prior to arrival. Reports sharp left flank pain that radiates to LLQ; pain is constant. Rates 10/10. Has not treated. Denies LOF, vaginal bleeding, dysuria, hematuria.  Positive fetal movement.  Last had intercourse at midnight.   OB History    Gravida Para Term Preterm AB TAB SAB Ectopic Multiple Living   0 0 0 0 0 0 2      Past Medical History  Diagnosis Date  . Medical history non-contributory     Past Surgical History  Procedure Laterality Date  . Cesarean section  09/02/2012    Procedure: CESAREAN SECTION;  Surgeon: Mickel Baas, MD;  Location: [facility];  Service: Obstetrics;  Laterality: N/A;  Primary Cesarean Section Delivery Baby Boy @ 2354, Apgars   . Cesarean section N/A 02/02/2014    Procedure: CESAREAN SECTION;  Surgeon: Freddrick March. Tenny Craw, MD;  Location: [facility];  Service: Obstetrics;  Laterality: N/A;    Family History  Problem Relation Age of Onset  . Heart disease Mother   . Hypertension Mother   . Stroke Father   . Kidney disease Father   . Diabetes Father   . Hyperlipidemia Father   . Asthma Brother   . Diabetes Maternal Aunt   . Cancer Maternal Grandmother   . Heart disease Maternal Grandfather     Social History  Substance Use Topics  . Smoking status: Never Smoker   . Smokeless tobacco: None  . Alcohol Use: No    Allergies: No Known Allergies  Prescriptions prior to admission  Medication Sig Dispense Refill Last Dose  . acetaminophen (TYLENOL) 325 MG tablet Take 650 mg by mouth every 6 (six) hours as needed.   Taking  . calcium carbonate (TUMS - DOSED IN MG ELEMENTAL CALCIUM) 500 MG  chewable tablet Chew 1 tablet by mouth as needed for indigestion. Reported on 03/23/2016   Not Taking  . docusate sodium (COLACE) 100 MG capsule Take 100 mg by mouth daily as needed. Reported on 03/23/2016   Not Taking  . oxyCODONE-acetaminophen (PERCOCET/ROXICET) 5-325 MG per tablet Take 1-2 tablets by mouth every 4 (four) hours as needed for severe pain (moderate - severe pain). (Patient not taking: Reported on 02/24/2016) 30 tablet 0 Not Taking  . polyethylene glycol (MIRALAX / GLYCOLAX) packet Take 17 g by mouth daily as needed for mild constipation. Reported on 03/23/2016   Not Taking  . Prenatal Vit-Fe Fumarate-FA (PRENATAL MULTIVITAMIN) TABS Take 1 tablet by mouth daily.   Taking  . ranitidine (ZANTAC) 150 MG tablet Take 150 mg by mouth 2 (two) times daily as needed for heartburn. Reported on 03/23/2016   Not Taking    Review of Systems  Constitutional: Negative.   Gastrointestinal: Positive for nausea, vomiting and abdominal pain.  Genitourinary: Positive for flank pain. Negative for dysuria, frequency and hematuria.   Physical Exam   Blood pressure 119/73, pulse 90, temperature 97.3 F (36.3 C), temperature source Oral, resp. rate 18, last menstrual period 11/03/2015, unknown if currently breastfeeding.  Physical Exam  Nursing note and vitals reviewed. Constitutional: She is oriented to  person, place, and time. She appears well-developed and well-nourished. She appears distressed (writhing in pain).  HENT:  Head: Normocephalic and atraumatic.  Eyes: Conjunctivae are normal. Right eye exhibits no discharge. Left eye exhibits no discharge. No scleral icterus.  Neck: Normal range of motion.  Cardiovascular: Normal rate, regular rhythm and normal heart sounds.   No murmur heard. Respiratory: Effort normal and breath sounds normal. No respiratory distress. She has no wheezes.  GI: Soft. There is no tenderness. There is no guarding.  Genitourinary: Vagina normal. Cervix exhibits no discharge  and no friability.  Neurological: She is alert and oriented to person, place, and time.  Skin: Skin is warm and dry. She is not diaphoretic.  Psychiatric: She has a normal mood and affect. Her behavior is normal. Judgment and thought content normal.   Fetal Tracing:  Baseline: 130 Variability: moderate Accelerations: none Decelerations: none  Toco: none   MAU Course  Procedures Results for orders placed or performed during the hospital encounter of 04/12/16 (from the past 24 hour(s))  CBC     Status: Abnormal   Collection Time: 04/12/16  3:20 AM  Result Value Ref Range   WBC 8.9 4.0 - 10.5 K/uL   RBC 3.59 (L) 3.87 - 5.11 MIL/uL   Hemoglobin 11.5 (L) 12.0 - 15.0 g/dL   HCT 96.033.0 (L) 45.436.0 - 09.846.0 %   MCV 91.9 78.0 - 100.0 fL   MCH 32.0 26.0 - 34.0 pg   MCHC 34.8 30.0 - 36.0 g/dL   RDW 11.913.7 14.711.5 - 82.915.5 %   Platelets 169 150 - 400 K/uL  Comprehensive metabolic panel     Status: Abnormal   Collection Time: 04/12/16  3:20 AM  Result Value Ref Range   Sodium 137 135 - 145 mmol/L   Potassium 3.5 3.5 - 5.1 mmol/L   Chloride 103 101 - 111 mmol/L   CO2 23 22 - 32 mmol/L   Glucose, Bld 97 65 - 99 mg/dL   BUN 17 6 - 20 mg/dL   Creatinine, Ser 5.620.70 0.44 - 1.00 mg/dL   Calcium 13.010.4 (H) 8.9 - 10.3 mg/dL   Total Protein 7.3 6.5 - 8.1 g/dL   Albumin 3.8 3.5 - 5.0 g/dL   AST 18 15 - 41 U/L   ALT 11 (L) 14 - 54 U/L   Alkaline Phosphatase 35 (L) 38 - 126 U/L   Total Bilirubin 0.6 0.3 - 1.2 mg/dL   GFR calc non Af Amer >60 >60 mL/min   GFR calc Af Amer >60 >60 mL/min   Anion gap 11 5 - 15  Urinalysis, Routine w reflex microscopic (not [facility])     Status: Abnormal   Collection Time: 04/12/16  3:55 AM  Result Value Ref Range   Color, Urine YELLOW YELLOW   APPearance CLEAR CLEAR   Specific Gravity, Urine <1.005 (L) 1.005 - 1.030   pH 6.5 5.0 - 8.0   Glucose, UA NEGATIVE NEGATIVE mg/dL   Hgb urine dipstick SMALL (A) NEGATIVE   Bilirubin Urine NEGATIVE NEGATIVE   Ketones, ur NEGATIVE  NEGATIVE mg/dL   Protein, ur NEGATIVE NEGATIVE mg/dL   Nitrite NEGATIVE NEGATIVE   Leukocytes, UA NEGATIVE NEGATIVE  Urine microscopic-add on     Status: Abnormal   Collection Time: 04/12/16  3:55 AM  Result Value Ref Range   Squamous Epithelial / LPF 0-5 (A) NONE SEEN   WBC, UA 0-5 0 - 5 WBC/hpf   RBC / HPF 0-5 0 - 5 RBC/hpf  Bacteria, UA RARE (A) NONE SEEN   Urine-Other MUCOUS PRESENT     US Renal  04/12/2016  CLINICAL DATA:  Pregnant patient in second trimester pregnancy with sudden onset of left flank pain. EXAM: RENAL / URINARY TRACT ULTRASOUND COMPLETE COMPARISON:  None. FINDINGS: Right Kidney: Length: 10.9 cm. Renal pyramids appear echogenic. No mass or hydronephrosis visualized. No shadowing stone. Left Kidney: Length: 11.8 cm. Mild left hydronephrosis and dilatation of the proximal ureter. Renal pyramids appear echogenic. Bladder: Appears normal for degree of bladder distention. The right ureteral jet is seen. Faint left ureteral jet is present. IMPRESSION: 1. Mild left hydronephrosis and proximal hydroureter. Left ureteral jet is only faintly visualized. Findings are suggestive of at least partially obstructing stone in the ureter. 2. Echogenic renal pyramids, a nonspecific finding. This can be seen in the setting of hypercalcemia, drug induced, hyperparathyroidism, nephrocalcinosis or other metabolic disorders. Electronically Signed   By: Rubye Oaks M.D.   On: 04/12/2016 04:15    MDM Category 1 tracing, no contractions Cervix 0.5 cm/thick ---cervical length 4.2 cm per ultrasound IV fluid bolus Dilaudid 1 mg IV Zofran 4 mg IV Pt reports improvement in symptoms but pain returned in < 1 hour -- dilaudid 1 mg reordered Pt vomiting again -- Phenergan 25 mg in bag of LR Ultrasound shows left hydronephrosis concerning for kidney stone S/w Dr. Tenny Craw. Will admit patient for pain control   Assessment and Plan  A: 1. Hydronephrosis of left kidney   2. [redacted] weeks gestation of  pregnancy   3. Abdominal pain affecting pregnancy   4. Flank pain     P: Admit -- pain control r/t nephrolithiasis  Judeth Horn 04/12/2016, 3:02 AM

## 2016-04-12 NOTE — H&P (Signed)
Bailey MillardCourtney M Woods is a 25 y.o. female presenting for left flank pain  25 yo G3P2002 @ 23+0 presents with acute onset of left lower quadrant & left flank pain. Upon presentation the patient was doubled over in pain and could barely stand. She required dilaudid to allow an exam. Imaging demonstrated left hydronephrosis and faint left ureteral jet suggestive of a partially obstructing stone.  The patient's pregnancy has been complicated by fetal; gastroschesis History OB History    Gravida Para Term Preterm AB TAB SAB Ectopic Multiple Living   3 2 2  0 0 0 0 0 0 2     Past Medical History  Diagnosis Date  . Medical history non-contributory    Past Surgical History  Procedure Laterality Date  . Cesarean section  09/02/2012    Procedure: CESAREAN SECTION;  Surgeon: Mickel Baasichard D Kaplan, MD;  Location: [facility];  Service: Obstetrics;  Laterality: N/A;  Primary Cesarean Section Delivery Baby Boy @ 2354, Apgars   . Cesarean section N/A 02/02/2014    Procedure: CESAREAN SECTION;  Surgeon: Freddrick MarchKendra H. Tenny Crawoss, MD;  Location: [facility];  Service: Obstetrics;  Laterality: N/A;   Family History: family history includes Asthma in her brother; Cancer in her maternal grandmother; Diabetes in her father and maternal aunt; Heart disease in her maternal grandfather and mother; Hyperlipidemia in her father; Hypertension in her mother; Kidney disease in her father; Stroke in her father. Social History:  reports that she has never smoked. She does not have any smokeless tobacco history on file. She reports that she does not drink alcohol or use illicit drugs.   Prenatal Transfer Tool  Maternal Diabetes: too early Genetic Screening: Declined Maternal Ultrasounds/Referrals: Abnormal:  Findings:   Other: Fetal gastroschesis Fetal Ultrasounds or other Referrals:  Referred to Materal Fetal Medicine  Maternal Substance Abuse:  No Significant Maternal Medications:  None Significant Maternal Lab Results:  None Other Comments:   None  ROS    Blood pressure 113/65, pulse 82, temperature 98.5 F (36.9 C), temperature source Oral, resp. rate 16, last menstrual period 11/03/2015, SpO2 100 %, unknown if currently breastfeeding. Exam Physical Exam  Prenatal labs: ABO, Rh: --/--/A NEG (05/24 0320) Antibody: NEG (05/24 0320) Rubella:  Immune RPR:   NR HBsAg:   Neg HIV:   NR GBS:   too early  Assessment/Plan: 1) Admit 2) Dilaudid PCA 3) Tamsulosin 0.4 mg QDay 4) IVF hydration 5) Strain urine   Bailey Woods H. 04/12/2016, 6:57 AM

## 2016-04-13 MED ORDER — TAMSULOSIN HCL 0.4 MG PO CAPS: 0.4000 mg | ORAL_CAPSULE | Freq: Every day | ORAL | Status: AC

## 2016-04-13 MED ORDER — OXYCODONE-ACETAMINOPHEN 5-325 MG PO TABS: 1.0000 | ORAL_TABLET | ORAL | Status: AC | PRN

## 2016-04-13 NOTE — Progress Notes (Signed)
Pt ambulated out teaching complete  

## 2016-04-13 NOTE — Progress Notes (Signed)
Patient ID: Bailey MillardCourtney M Ludlum, female   DOB: 04/28/1991, 25 y.o.   MRN: [id number]    Subjective: Pt has been doing well. Only required one pill of oxycodone last night at 7 PM.  Otherwise, she denies any pain.  Tolerating diet.  No fever.  Objective: Vital signs in last 24 hours: Temp:  [97.8 F (36.6 C)-98.6 F (37 C)] 98 F (36.7 C) (05/25 0500) Pulse Rate:  [73-93] 87 (05/25 0500) Resp:  [11-16] 14 (05/25 0500) BP: (94-109)/(46-67) 106/59 mmHg (05/25 0500) SpO2:  [99 %-100 %] 99 % (05/25 0500)  Intake/Output from previous day: 05/24 0701 - 05/25 0700 In: 2310 [P.O.:540; I.V.:1770] Out: 2800 [Urine:2800] Intake/Output this shift:    Physical Exam:  General: Alert and oriented Abdomen: Soft, gravid, No CVAT  Lab Results:  Recent Labs  04/12/16 0320  HGB 11.5*  HCT 33.0*   BMET  Recent Labs  04/12/16 0320  NA 137  K 3.5  CL 103  CO2 23  GLUCOSE 97  BUN 17  CREATININE 0.70  CALCIUM 10.4*     Studies/Results:  Assessment/Plan: 1. Left hydronephrosis due to probable left ureteral stone:  Her pain is controlled and she is tolerating diet.  No indication for surgical intervention or further imaging at this time.  She can be discharged with oral pain medication and alpha blocker therapy and continue to strain her urine.  I will arrange outpatient follow up in 2 weeks with a repeat renal ultrasound in our office.  She has been instructed to call us if she develops fever, uncontrolled pain, or uncontrollable nausea and vomiting.     Miquela Costabile,LES 04/13/2016, 7:28 AM

## 2016-04-13 NOTE — Progress Notes (Signed)
25 y.o. [id number] 6321w1d HD# admitted for 23 WEEKS SEVERE LEFT SIDE PAIN.  Pt currently stable with no c/o this am.  Good FM.  Pain is much better and tolerating PO.  Filed Vitals:   04/12/16 1838 04/12/16 2145 04/13/16 0500 04/13/16 0731  BP: 102/64 103/57 106/59   Pulse: 89 93 87   Temp: 98 F (36.7 C) 98.3 F (36.8 C) 98 F (36.7 C)   TempSrc: Axillary Oral Oral   Resp: 16 14 14    Weight:    65.32 kg (144 lb 0.1 oz)  SpO2: 100% 99% 99%     Lungs CTA Cor RRR Abd  Soft, gravid, nontender Ex SCDs FHTs present   No results found for this or any previous visit (from the past 24 hour(s)).  A:  HD#  [id number] with hydronephrosis from probable kidney stone.  P:Left hydronephrosis due to probable left ureteral stone: Her pain is controlled and she is tolerating diet. No indication for surgical intervention or further imaging at this time. She can be discharged with oral pain medication and alpha blocker therapy and continue to strain her urine.  Fredy Gladu A

## 2016-04-13 NOTE — Discharge Summary (Signed)
Physician Discharge Summary  Patient ID: Bailey Woods MRN: [id number] DOB/AGE: [birth year] 25 y.o.  Admit date: 04/12/2016 Discharge date: 04/13/2016  Admission Diagnoses:hydronephrosis from probable kidney stone  Discharge Diagnoses: same Active Problems:   Nephrolithiasis   Discharged Condition: good  Hospital Course: uncomplicated  Consults: urology  Significant Diagnostic Studies: labs:  Results for orders placed or performed during the hospital encounter of 04/12/16 (from the past 72 hour(s))  CBC     Status: Abnormal   Collection Time: 04/12/16  3:20 AM  Result Value Ref Range   WBC 8.9 4.0 - 10.5 K/uL   RBC 3.59 (L) 3.87 - 5.11 MIL/uL   Hemoglobin 11.5 (L) 12.0 - 15.0 g/dL   HCT 33.0 (L) 36.0 - 46.0 %   MCV 91.9 78.0 - 100.0 fL   MCH 32.0 26.0 - 34.0 pg   MCHC 34.8 30.0 - 36.0 g/dL   RDW 13.7 11.5 - 15.5 %   Platelets 169 150 - 400 K/uL  Comprehensive metabolic panel     Status: Abnormal   Collection Time: 04/12/16  3:20 AM  Result Value Ref Range   Sodium 137 135 - 145 mmol/L   Potassium 3.5 3.5 - 5.1 mmol/L   Chloride 103 101 - 111 mmol/L   CO2 23 22 - 32 mmol/L   Glucose, Bld 97 65 - 99 mg/dL   BUN 17 6 - 20 mg/dL   Creatinine, Ser 0.70 0.44 - 1.00 mg/dL   Calcium 10.4 (H) 8.9 - 10.3 mg/dL   Total Protein 7.3 6.5 - 8.1 g/dL   Albumin 3.8 3.5 - 5.0 g/dL   AST 18 15 - 41 U/L   ALT 11 (L) 14 - 54 U/L   Alkaline Phosphatase 35 (L) 38 - 126 U/L   Total Bilirubin 0.6 0.3 - 1.2 mg/dL   GFR calc non Af Amer >60 >60 mL/min   GFR calc Af Amer >60 >60 mL/min    Comment: (NOTE) The eGFR has been calculated using the CKD EPI equation. This calculation has not been validated in all clinical situations. eGFR's persistently <60 mL/min signify possible Chronic Kidney Disease.    Anion gap 11 5 - 15  Type and screen [place]     Status: None   Collection Time: 04/12/16  3:20 AM  Result Value Ref Range   ABO/RH(D) A NEG    Antibody  Screen NEG    Sample Expiration 04/15/2016   Urinalysis, Routine w reflex microscopic (not at [facility])     Status: Abnormal   Collection Time: 04/12/16  3:55 AM  Result Value Ref Range   Color, Urine YELLOW YELLOW   APPearance CLEAR CLEAR   Specific Gravity, Urine <1.005 (L) 1.005 - 1.030   pH 6.5 5.0 - 8.0   Glucose, UA NEGATIVE NEGATIVE mg/dL   Hgb urine dipstick SMALL (A) NEGATIVE   Bilirubin Urine NEGATIVE NEGATIVE   Ketones, ur NEGATIVE NEGATIVE mg/dL   Protein, ur NEGATIVE NEGATIVE mg/dL   Nitrite NEGATIVE NEGATIVE   Leukocytes, UA NEGATIVE NEGATIVE  Urine microscopic-add on     Status: Abnormal   Collection Time: 04/12/16  3:55 AM  Result Value Ref Range   Squamous Epithelial / LPF 0-5 (A) NONE SEEN   WBC, UA 0-5 0 - 5 WBC/hpf   RBC / HPF 0-5 0 - 5 RBC/hpf   Bacteria, UA RARE (A) NONE SEEN   Urine-Other MUCOUS PRESENT     Treatments: IV hydration and pain control  Discharge Exam:  Blood pressure 106/59, pulse 87, temperature 98 F (36.7 C), temperature source Oral, resp. rate 14, weight 65.32 kg (144 lb 0.1 oz), last menstrual period 11/03/2015, SpO2 99 %, unknown if currently breastfeeding.   Disposition: 01-Home or Self Care  Discharge Instructions    Discharge activity:  Up to eat    Complete by:  As directed      Discharge diet:  No restrictions    Complete by:  As directed      Discharge instructions    Complete by:  As directed   Modified bedrest.  Refrain from intercourse.  Count baby's movements in 1 hour per day- if you don't get 6 in that hour, call.     Do not have sex or do anything that might make you have an orgasm    Complete by:  As directed      Fetal Kick Count:  Lie on our left side for one hour after a meal, and count the number of times your baby kicks.  If it is less than 5 times, get up, move around and drink some juice.  Repeat the test 30 minutes later.  If it is still less than 5 kicks in an hour, notify your doctor.    Complete by:  As  directed      LABOR:  When conractions begin, you should start to time them from the beginning of one contraction to the beginning  of the next.  When contractions are 5 - 10 minutes apart or less and have been regular for at least an hour, you should call your health care provider.    Complete by:  As directed      Notify physician for a general feeling that "something is not right"    Complete by:  As directed      Notify physician for bleeding from the vagina    Complete by:  As directed      Notify physician for blurring of vision or spots before the eyes    Complete by:  As directed      Notify physician for chills or fever    Complete by:  As directed      Notify physician for fainting spells, "black outs" or loss of consciousness    Complete by:  As directed      Notify physician for increase in vaginal discharge    Complete by:  As directed      Notify physician for increase or change in vaginal discharge    Complete by:  As directed      Notify physician for intestinal cramps, with or without diarrhea, sometimes described as "gas pain"    Complete by:  As directed      Notify physician for leaking of fluid    Complete by:  As directed      Notify physician for leaking of fluid    Complete by:  As directed      Notify physician for low, dull backache, unrelieved by heat or Tylenol    Complete by:  As directed      Notify physician for menstrual like cramps    Complete by:  As directed      Notify physician for pain or burning when urinating    Complete by:  As directed      Notify physician for pelvic pressure (sudden increase)    Complete by:  As directed      Notify physician for pelvic pressure  Complete by:  As directed      Notify physician for severe or continued nausea or vomiting    Complete by:  As directed      Notify physician for sudden gushing of fluid from the vagina (with or without continued leaking)    Complete by:  As directed      Notify physician  for sudden, constant, or occasional abdominal pain    Complete by:  As directed      Notify physician for uterine contractions.  These may be painless and feel like the uterus is tightening or the baby is  "balling up"    Complete by:  As directed      Notify physician for vaginal bleeding    Complete by:  As directed      Notify physician if baby moving less than usual    Complete by:  As directed      PRETERM LABOR:  Includes any of the follwing symptoms that occur between 20 - [redacted] weeks gestation.  If these symptoms are not stopped, preterm labor can result in preterm delivery, placing your baby at risk    Complete by:  As directed             Medication List    STOP taking these medications        calcium carbonate 500 MG chewable tablet  Commonly known as:  TUMS - dosed in mg elemental calcium     TYLENOL 325 MG tablet  Generic drug:  acetaminophen      TAKE these medications        oxyCODONE-acetaminophen 5-325 MG tablet  Commonly known as:  ROXICET  Take 1 tablet by mouth every 4 (four) hours as needed for severe pain.     prenatal multivitamin Tabs tablet  Take 1 tablet by mouth daily.     ranitidine 150 MG tablet  Commonly known as:  ZANTAC  Take 150 mg by mouth 2 (two) times daily as needed for heartburn. Reported on 03/23/2016     tamsulosin 0.4 MG Caps capsule  Commonly known as:  FLOMAX  Take 1 capsule (0.4 mg total) by mouth daily.           Follow-up Information    Follow up with Dutch Gray, MD.   Specialty:  Urology   Why:  Our office will call to arrange outpatient follow up   Contact information:   [place]  [id number] [phone]       Follow up with [facility] In 2 weeks.   Contact information:   [address] [place] [address] [phone]       Signed: Daria Pastures 04/13/2016, 8:34 AM

## 2016-04-14 LAB — URINE CULTURE

## 2016-04-21 ENCOUNTER — Other Ambulatory Visit (HOSPITAL_COMMUNITY): Payer: Self-pay | Admitting: Maternal and Fetal Medicine

## 2016-04-21 ENCOUNTER — Encounter (HOSPITAL_COMMUNITY): Payer: Self-pay

## 2016-04-21 ENCOUNTER — Ambulatory Visit (HOSPITAL_COMMUNITY)
Admission: RE | Admit: 2016-04-21 | Discharge: 2016-04-21 | Disposition: A | Discharge status: Home / Self Care | Payer: BLUE CROSS/BLUE SHIELD | Source: Ambulatory Visit | Attending: Obstetrics and Gynecology | Admitting: Obstetrics and Gynecology

## 2016-04-21 DIAGNOSIS — O358XX0 Maternal care for other (suspected) fetal abnormality and damage, not applicable or unspecified: Secondary | ICD-10-CM

## 2016-04-21 DIAGNOSIS — Z3A24 24 weeks gestation of pregnancy: Secondary | ICD-10-CM

## 2016-04-21 DIAGNOSIS — O35DXX0 Maternal care for other (suspected) fetal abnormality and damage, fetal gastrointestinal anomalies, not applicable or unspecified: Secondary | ICD-10-CM

## 2016-04-21 DIAGNOSIS — O359XX0 Maternal care for (suspected) fetal abnormality and damage, unspecified, not applicable or unspecified: Secondary | ICD-10-CM

## 2016-05-19 ENCOUNTER — Ambulatory Visit (HOSPITAL_COMMUNITY)
Admission: RE | Admit: 2016-05-19 | Discharge: 2016-05-19 | Disposition: A | Discharge status: Home / Self Care | Payer: BLUE CROSS/BLUE SHIELD | Source: Ambulatory Visit | Attending: Obstetrics and Gynecology | Admitting: Obstetrics and Gynecology

## 2016-05-19 ENCOUNTER — Other Ambulatory Visit (HOSPITAL_COMMUNITY): Payer: Self-pay | Admitting: Maternal and Fetal Medicine

## 2016-05-19 ENCOUNTER — Encounter (HOSPITAL_COMMUNITY): Payer: Self-pay

## 2016-05-19 DIAGNOSIS — O358XX0 Maternal care for other (suspected) fetal abnormality and damage, not applicable or unspecified: Secondary | ICD-10-CM | POA: Diagnosis not present

## 2016-05-19 DIAGNOSIS — O34219 Maternal care for unspecified type scar from previous cesarean delivery: Secondary | ICD-10-CM | POA: Insufficient documentation

## 2016-05-19 DIAGNOSIS — O35DXX0 Maternal care for other (suspected) fetal abnormality and damage, fetal gastrointestinal anomalies, not applicable or unspecified: Secondary | ICD-10-CM

## 2016-05-19 DIAGNOSIS — Z3A28 28 weeks gestation of pregnancy: Secondary | ICD-10-CM | POA: Diagnosis not present

## 2016-06-03 IMAGING — US US RENAL
1 series · 15 of 25 positions shown · non-contrast
Comparison: None.

CLINICAL DATA: Pregnant patient in second trimester pregnancy with
sudden onset of left flank pain.

EXAM:
RENAL / URINARY TRACT ULTRASOUND COMPLETE

[Series 1: us renal · 15 of 51 slices shown]
[im 1/51]
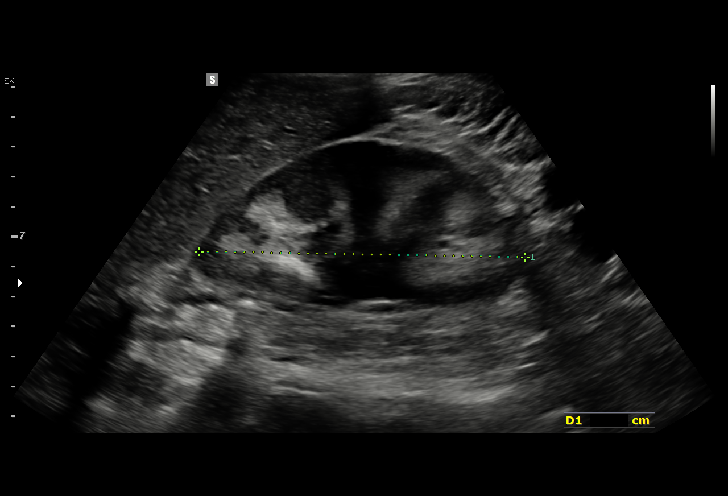
[im 5/51]
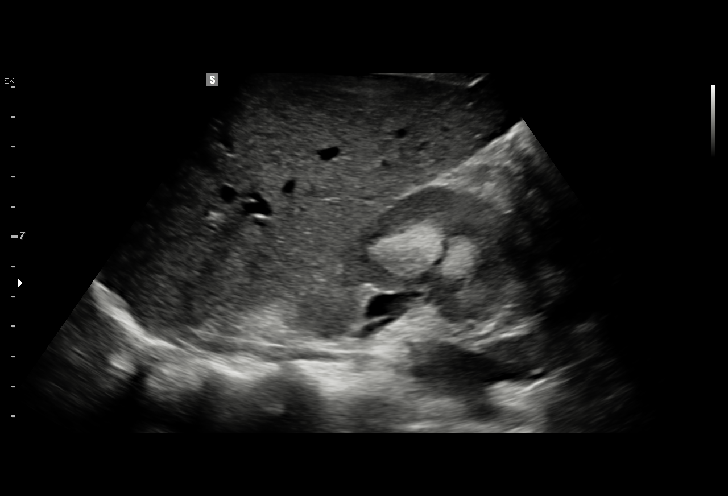
[im 9/51]
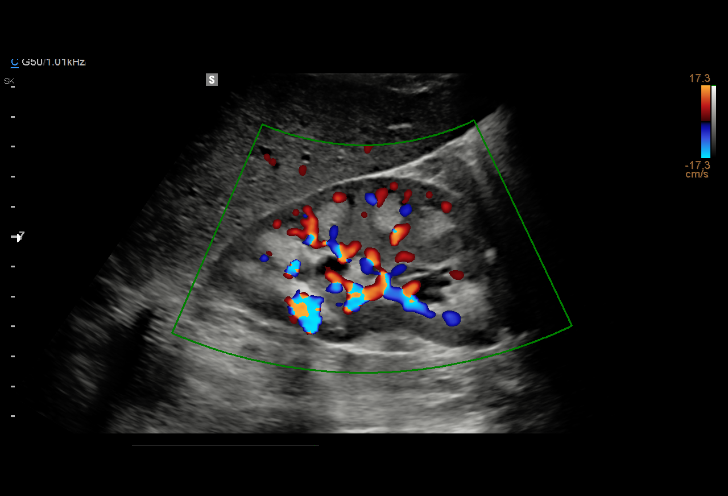
[im 11/51]
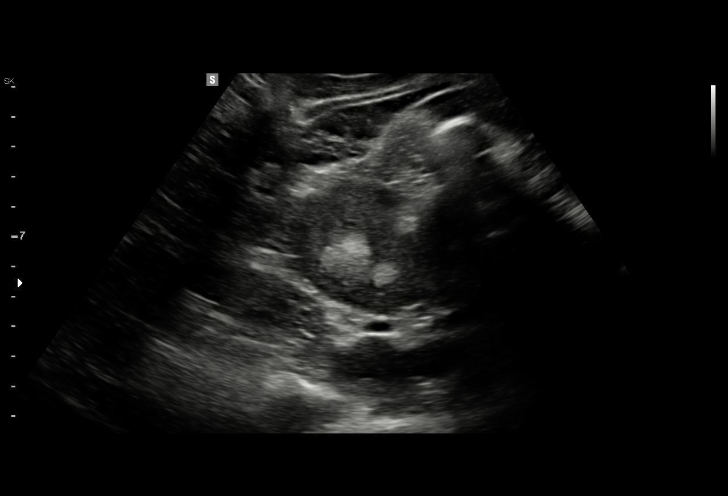
[im 15/51]
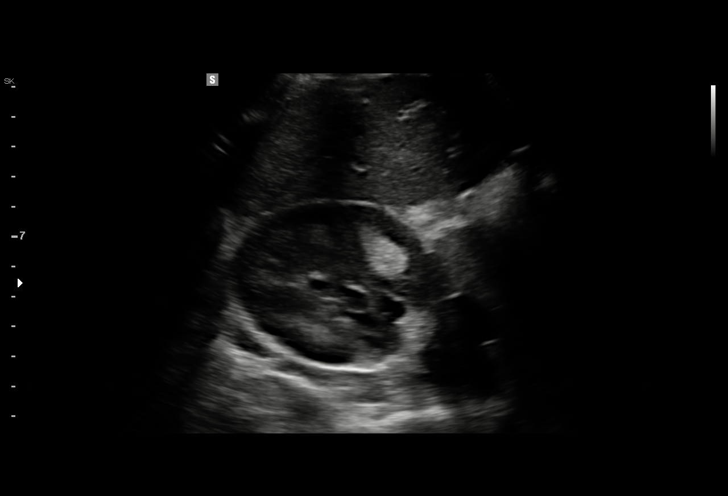
[im 19/51]
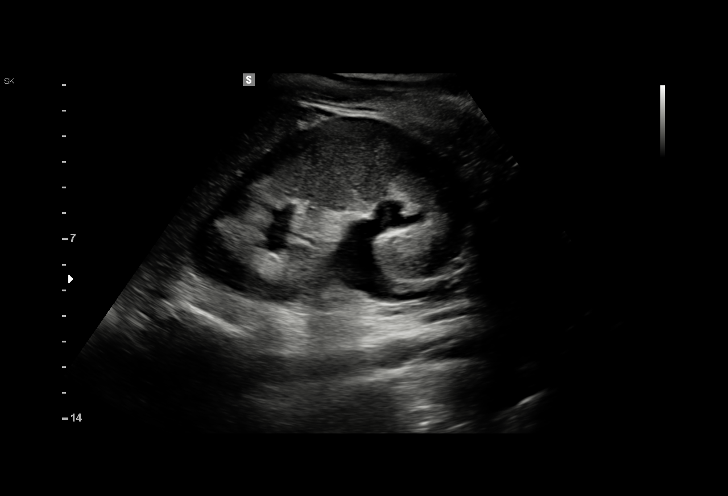
[im 21/51]
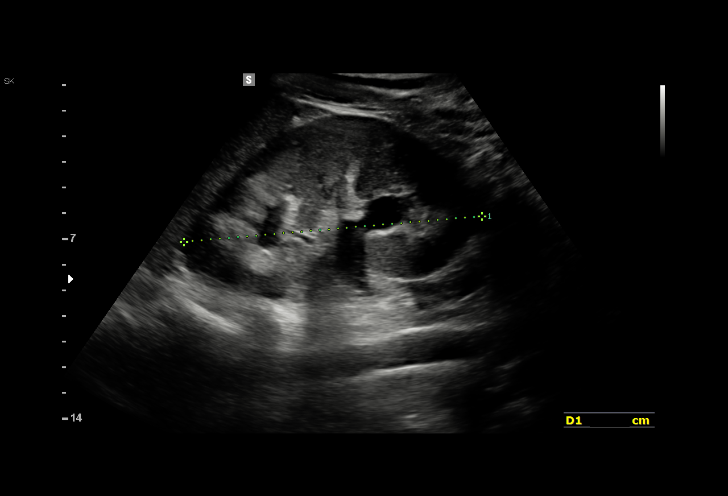
[im 26/51]
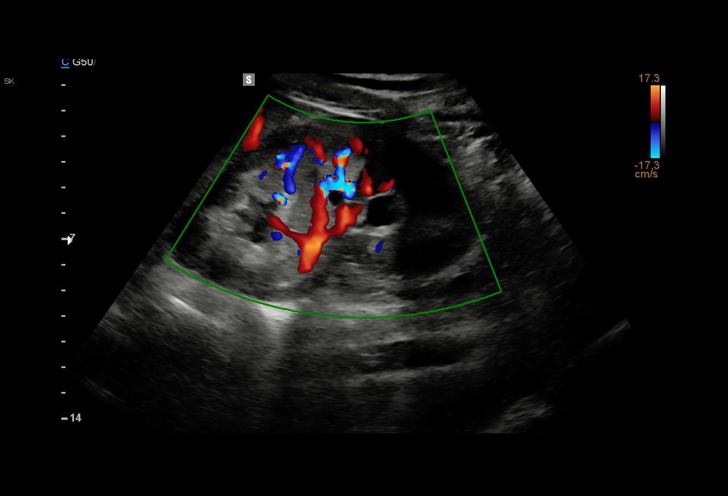
[im 30/51]
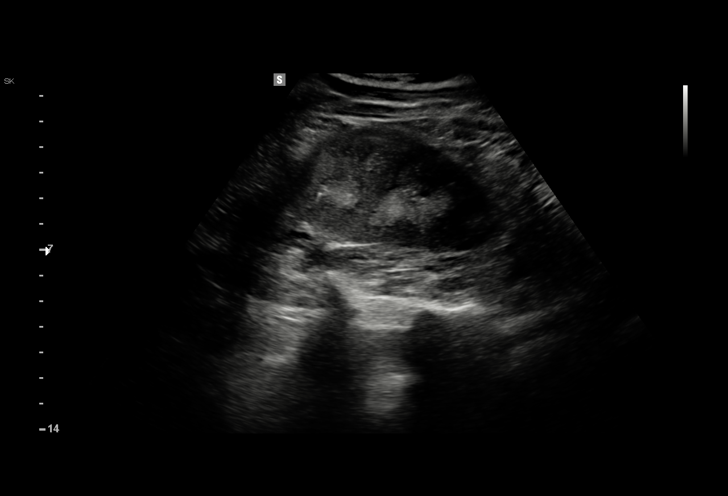
[im 32/51]
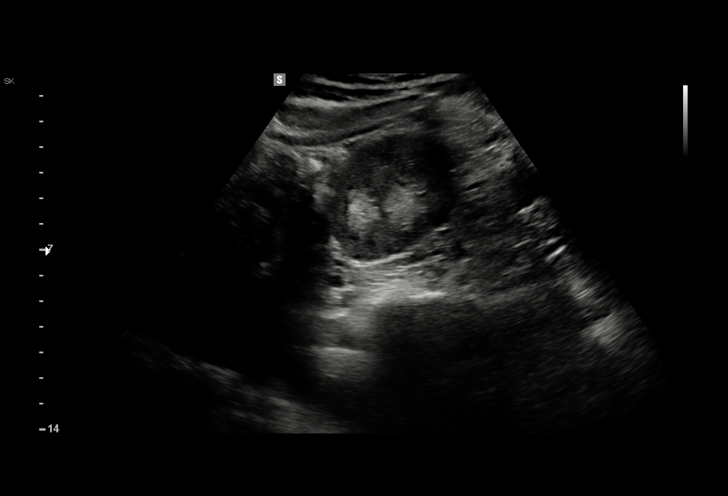
[im 36/51]
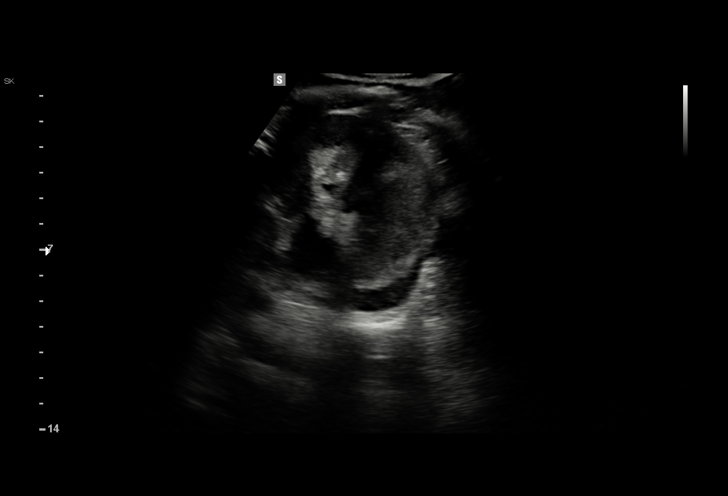
[im 40/51]
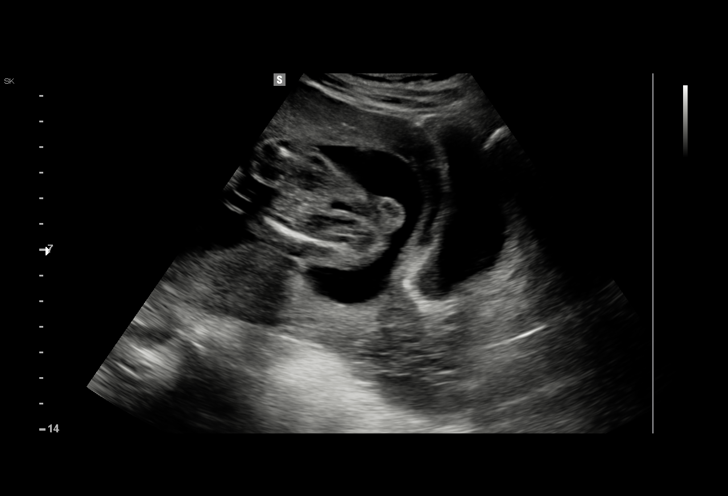
[im 42/51]
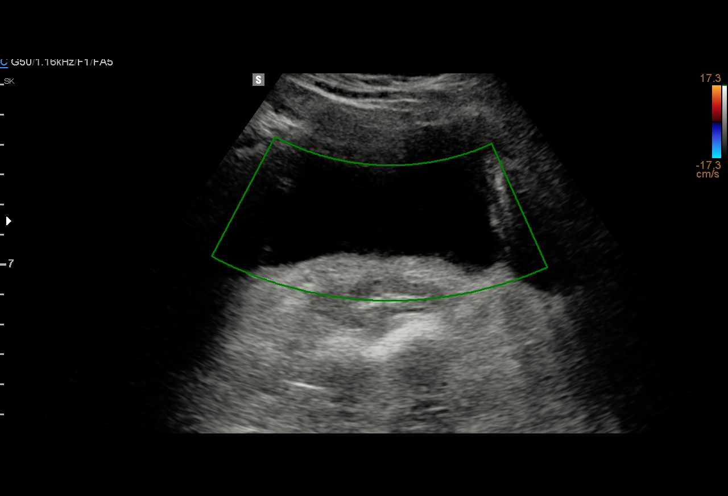
[im 46/51]
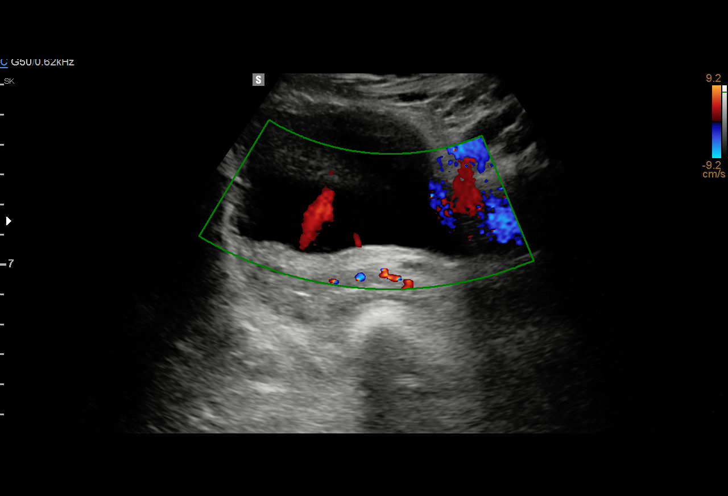
[im 51/51]
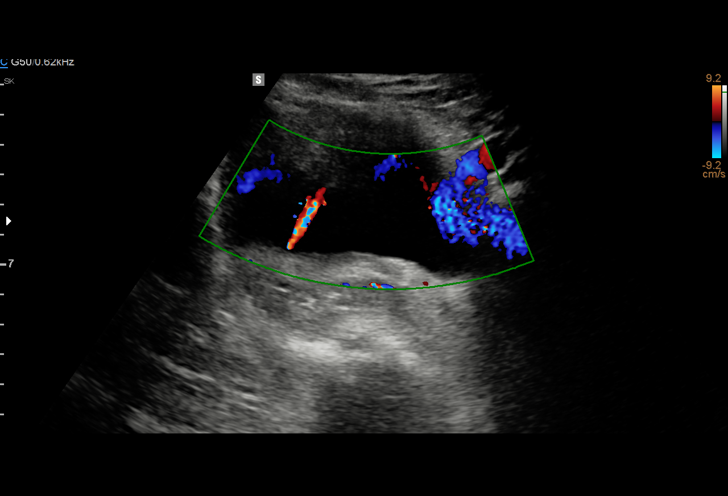

[15 of 25 positions shown; findings below may reference images not displayed]

FINDINGS: Right Kidney:

Length: 10.9 cm. Renal pyramids appear echogenic. No mass or
hydronephrosis visualized. No shadowing stone.

Left Kidney:

Length: 11.8 cm. Mild left hydronephrosis and dilatation of the
proximal ureter. Renal pyramids appear echogenic.

Bladder:

Appears normal for degree of bladder distention. The right ureteral
jet is seen. Faint left ureteral jet is present.
IMPRESSION: 1. Mild left hydronephrosis and proximal hydroureter. Left ureteral
jet is only faintly visualized. Findings are suggestive of at least
partially obstructing stone in the ureter.
2. Echogenic renal pyramids, a nonspecific finding. This can be seen
in the setting of hypercalcemia, drug induced, hyperparathyroidism,
nephrocalcinosis or other metabolic disorders.

## 2016-06-09 ENCOUNTER — Ambulatory Visit (HOSPITAL_COMMUNITY)
Admission: RE | Admit: 2016-06-09 | Discharge: 2016-06-09 | Disposition: A | Discharge status: Home / Self Care | Payer: BLUE CROSS/BLUE SHIELD | Source: Ambulatory Visit | Attending: Obstetrics and Gynecology | Admitting: Obstetrics and Gynecology

## 2016-06-09 ENCOUNTER — Other Ambulatory Visit (HOSPITAL_COMMUNITY): Payer: Self-pay | Admitting: Maternal and Fetal Medicine

## 2016-06-09 ENCOUNTER — Encounter (HOSPITAL_COMMUNITY): Payer: Self-pay

## 2016-06-09 DIAGNOSIS — O34219 Maternal care for unspecified type scar from previous cesarean delivery: Secondary | ICD-10-CM

## 2016-06-09 DIAGNOSIS — O365930 Maternal care for other known or suspected poor fetal growth, third trimester, not applicable or unspecified: Secondary | ICD-10-CM | POA: Diagnosis not present

## 2016-06-09 DIAGNOSIS — O358XX0 Maternal care for other (suspected) fetal abnormality and damage, not applicable or unspecified: Secondary | ICD-10-CM | POA: Insufficient documentation

## 2016-06-09 DIAGNOSIS — Z3A31 31 weeks gestation of pregnancy: Secondary | ICD-10-CM | POA: Diagnosis not present

## 2016-06-09 DIAGNOSIS — O35DXX0 Maternal care for other (suspected) fetal abnormality and damage, fetal gastrointestinal anomalies, not applicable or unspecified: Secondary | ICD-10-CM

## 2016-06-15 ENCOUNTER — Ambulatory Visit (HOSPITAL_COMMUNITY)
Admission: RE | Admit: 2016-06-15 | Discharge: 2016-06-15 | Disposition: A | Discharge status: Home / Self Care | Payer: BLUE CROSS/BLUE SHIELD | Source: Ambulatory Visit | Attending: Obstetrics and Gynecology | Admitting: Obstetrics and Gynecology

## 2016-06-15 ENCOUNTER — Encounter (HOSPITAL_COMMUNITY): Payer: Self-pay

## 2016-06-15 DIAGNOSIS — Z3A32 32 weeks gestation of pregnancy: Secondary | ICD-10-CM | POA: Diagnosis not present

## 2016-06-15 DIAGNOSIS — O365930 Maternal care for other known or suspected poor fetal growth, third trimester, not applicable or unspecified: Secondary | ICD-10-CM | POA: Diagnosis not present

## 2016-06-15 DIAGNOSIS — O34219 Maternal care for unspecified type scar from previous cesarean delivery: Secondary | ICD-10-CM | POA: Insufficient documentation

## 2016-06-15 DIAGNOSIS — O358XX0 Maternal care for other (suspected) fetal abnormality and damage, not applicable or unspecified: Secondary | ICD-10-CM | POA: Insufficient documentation

## 2016-06-15 DIAGNOSIS — O35DXX0 Maternal care for other (suspected) fetal abnormality and damage, fetal gastrointestinal anomalies, not applicable or unspecified: Secondary | ICD-10-CM

## 2016-06-16 ENCOUNTER — Ambulatory Visit (HOSPITAL_COMMUNITY): Payer: BLUE CROSS/BLUE SHIELD

## 2016-06-19 ENCOUNTER — Encounter (HOSPITAL_COMMUNITY): Payer: Self-pay

## 2016-06-19 ENCOUNTER — Ambulatory Visit (HOSPITAL_COMMUNITY)
Admission: RE | Admit: 2016-06-19 | Discharge: 2016-06-19 | Disposition: A | Discharge status: Home / Self Care | Payer: BLUE CROSS/BLUE SHIELD | Source: Ambulatory Visit | Attending: Obstetrics and Gynecology | Admitting: Obstetrics and Gynecology

## 2016-06-19 DIAGNOSIS — O34219 Maternal care for unspecified type scar from previous cesarean delivery: Secondary | ICD-10-CM | POA: Diagnosis not present

## 2016-06-19 DIAGNOSIS — O358XX0 Maternal care for other (suspected) fetal abnormality and damage, not applicable or unspecified: Secondary | ICD-10-CM | POA: Insufficient documentation

## 2016-06-19 DIAGNOSIS — Z3A32 32 weeks gestation of pregnancy: Secondary | ICD-10-CM | POA: Insufficient documentation

## 2016-06-19 DIAGNOSIS — O365930 Maternal care for other known or suspected poor fetal growth, third trimester, not applicable or unspecified: Secondary | ICD-10-CM | POA: Diagnosis not present

## 2016-06-22 ENCOUNTER — Other Ambulatory Visit (HOSPITAL_COMMUNITY): Payer: BLUE CROSS/BLUE SHIELD

## 2016-06-23 ENCOUNTER — Encounter (HOSPITAL_COMMUNITY): Payer: Self-pay

## 2016-06-23 ENCOUNTER — Other Ambulatory Visit (HOSPITAL_COMMUNITY): Payer: Self-pay | Admitting: Maternal and Fetal Medicine

## 2016-06-23 ENCOUNTER — Ambulatory Visit (HOSPITAL_COMMUNITY)
Admission: RE | Admit: 2016-06-23 | Discharge: 2016-06-23 | Disposition: A | Discharge status: Home / Self Care | Payer: BLUE CROSS/BLUE SHIELD | Source: Ambulatory Visit | Attending: Obstetrics and Gynecology | Admitting: Obstetrics and Gynecology

## 2016-06-23 DIAGNOSIS — O35DXX0 Maternal care for other (suspected) fetal abnormality and damage, fetal gastrointestinal anomalies, not applicable or unspecified: Secondary | ICD-10-CM

## 2016-06-23 DIAGNOSIS — O365930 Maternal care for other known or suspected poor fetal growth, third trimester, not applicable or unspecified: Secondary | ICD-10-CM | POA: Diagnosis not present

## 2016-06-23 DIAGNOSIS — Z3A33 33 weeks gestation of pregnancy: Secondary | ICD-10-CM

## 2016-06-23 DIAGNOSIS — O34219 Maternal care for unspecified type scar from previous cesarean delivery: Secondary | ICD-10-CM

## 2016-06-23 DIAGNOSIS — O358XX0 Maternal care for other (suspected) fetal abnormality and damage, not applicable or unspecified: Secondary | ICD-10-CM

## 2016-06-29 ENCOUNTER — Ambulatory Visit (HOSPITAL_COMMUNITY)
Admission: RE | Admit: 2016-06-29 | Discharge: 2016-06-29 | Disposition: A | Discharge status: Home / Self Care | Payer: BLUE CROSS/BLUE SHIELD | Source: Ambulatory Visit | Attending: Obstetrics and Gynecology | Admitting: Obstetrics and Gynecology

## 2016-06-29 ENCOUNTER — Other Ambulatory Visit (HOSPITAL_COMMUNITY): Payer: Self-pay | Admitting: Maternal and Fetal Medicine

## 2016-06-29 ENCOUNTER — Encounter (HOSPITAL_COMMUNITY): Payer: Self-pay

## 2016-06-29 DIAGNOSIS — O365930 Maternal care for other known or suspected poor fetal growth, third trimester, not applicable or unspecified: Secondary | ICD-10-CM

## 2016-06-29 DIAGNOSIS — O358XX0 Maternal care for other (suspected) fetal abnormality and damage, not applicable or unspecified: Secondary | ICD-10-CM

## 2016-06-29 DIAGNOSIS — Z3A34 34 weeks gestation of pregnancy: Secondary | ICD-10-CM

## 2016-06-29 DIAGNOSIS — O34219 Maternal care for unspecified type scar from previous cesarean delivery: Secondary | ICD-10-CM | POA: Insufficient documentation

## 2016-06-29 DIAGNOSIS — O35DXX0 Maternal care for other (suspected) fetal abnormality and damage, fetal gastrointestinal anomalies, not applicable or unspecified: Secondary | ICD-10-CM

## 2016-06-29 NOTE — ED Notes (Signed)
Pt reports SOB, vital signs stable pulse ox 99% on room air.

## 2016-07-03 ENCOUNTER — Ambulatory Visit (HOSPITAL_COMMUNITY)
Admission: RE | Admit: 2016-07-03 | Discharge: 2016-07-03 | Disposition: A | Discharge status: Home / Self Care | Payer: BLUE CROSS/BLUE SHIELD | Source: Ambulatory Visit | Attending: Obstetrics and Gynecology | Admitting: Obstetrics and Gynecology

## 2016-07-03 ENCOUNTER — Encounter (HOSPITAL_COMMUNITY): Payer: Self-pay

## 2016-07-03 DIAGNOSIS — O358XX0 Maternal care for other (suspected) fetal abnormality and damage, not applicable or unspecified: Secondary | ICD-10-CM | POA: Diagnosis not present

## 2016-07-03 DIAGNOSIS — Z3A34 34 weeks gestation of pregnancy: Secondary | ICD-10-CM | POA: Insufficient documentation

## 2016-07-03 DIAGNOSIS — O34219 Maternal care for unspecified type scar from previous cesarean delivery: Secondary | ICD-10-CM | POA: Insufficient documentation

## 2016-07-06 ENCOUNTER — Ambulatory Visit (HOSPITAL_COMMUNITY): Admission: RE | Admit: 2016-07-06 | Payer: BLUE CROSS/BLUE SHIELD | Source: Ambulatory Visit

## 2016-07-06 ENCOUNTER — Other Ambulatory Visit (HOSPITAL_COMMUNITY): Payer: Self-pay | Admitting: Maternal and Fetal Medicine

## 2016-07-06 ENCOUNTER — Encounter (HOSPITAL_COMMUNITY): Payer: Self-pay

## 2016-07-06 ENCOUNTER — Ambulatory Visit (HOSPITAL_COMMUNITY)
Admission: RE | Admit: 2016-07-06 | Discharge: 2016-07-06 | Disposition: A | Discharge status: Home / Self Care | Payer: BLUE CROSS/BLUE SHIELD | Source: Ambulatory Visit | Attending: Obstetrics and Gynecology | Admitting: Obstetrics and Gynecology

## 2016-07-06 DIAGNOSIS — Z3A35 35 weeks gestation of pregnancy: Secondary | ICD-10-CM | POA: Insufficient documentation

## 2016-07-06 DIAGNOSIS — O35DXX0 Maternal care for other (suspected) fetal abnormality and damage, fetal gastrointestinal anomalies, not applicable or unspecified: Secondary | ICD-10-CM

## 2016-07-06 DIAGNOSIS — O358XX0 Maternal care for other (suspected) fetal abnormality and damage, not applicable or unspecified: Secondary | ICD-10-CM | POA: Diagnosis present

## 2016-07-06 DIAGNOSIS — O365930 Maternal care for other known or suspected poor fetal growth, third trimester, not applicable or unspecified: Secondary | ICD-10-CM

## 2016-07-06 DIAGNOSIS — O34219 Maternal care for unspecified type scar from previous cesarean delivery: Secondary | ICD-10-CM

## 2016-07-10 ENCOUNTER — Ambulatory Visit (HOSPITAL_COMMUNITY): Payer: BLUE CROSS/BLUE SHIELD

## 2016-07-10 ENCOUNTER — Ambulatory Visit (HOSPITAL_COMMUNITY): Admission: RE | Admit: 2016-07-10 | Payer: BLUE CROSS/BLUE SHIELD | Source: Ambulatory Visit

## 2016-07-13 ENCOUNTER — Encounter (HOSPITAL_COMMUNITY): Payer: Self-pay

## 2016-07-13 ENCOUNTER — Ambulatory Visit (HOSPITAL_COMMUNITY)
Admission: RE | Admit: 2016-07-13 | Discharge: 2016-07-13 | Disposition: A | Discharge status: Home / Self Care | Payer: BLUE CROSS/BLUE SHIELD | Source: Ambulatory Visit | Attending: Obstetrics and Gynecology | Admitting: Obstetrics and Gynecology

## 2016-07-13 ENCOUNTER — Other Ambulatory Visit (HOSPITAL_COMMUNITY): Payer: Self-pay | Admitting: Maternal and Fetal Medicine

## 2016-07-13 DIAGNOSIS — O34219 Maternal care for unspecified type scar from previous cesarean delivery: Secondary | ICD-10-CM

## 2016-07-13 DIAGNOSIS — Z3A36 36 weeks gestation of pregnancy: Secondary | ICD-10-CM | POA: Insufficient documentation

## 2016-07-13 DIAGNOSIS — O358XX0 Maternal care for other (suspected) fetal abnormality and damage, not applicable or unspecified: Secondary | ICD-10-CM | POA: Diagnosis present

## 2016-07-13 DIAGNOSIS — O35DXX0 Maternal care for other (suspected) fetal abnormality and damage, fetal gastrointestinal anomalies, not applicable or unspecified: Secondary | ICD-10-CM

## 2016-07-20 ENCOUNTER — Encounter (HOSPITAL_COMMUNITY): Payer: Self-pay

## 2016-07-20 ENCOUNTER — Inpatient Hospital Stay (HOSPITAL_COMMUNITY)
Admission: AD | Admit: 2016-07-20 | Discharge: 2016-07-20 | Disposition: A | Discharge status: Other Acute Inpatient Hospital | Payer: BLUE CROSS/BLUE SHIELD | Source: Ambulatory Visit | Attending: Obstetrics and Gynecology | Admitting: Obstetrics and Gynecology

## 2016-07-20 ENCOUNTER — Ambulatory Visit (HOSPITAL_COMMUNITY): Admission: RE | Admit: 2016-07-20 | Payer: BLUE CROSS/BLUE SHIELD | Source: Ambulatory Visit

## 2016-07-20 DIAGNOSIS — O358XX0 Maternal care for other (suspected) fetal abnormality and damage, not applicable or unspecified: Secondary | ICD-10-CM | POA: Diagnosis not present

## 2016-07-20 DIAGNOSIS — Z3483 Encounter for supervision of other normal pregnancy, third trimester: Secondary | ICD-10-CM

## 2016-07-20 DIAGNOSIS — O359XX1 Maternal care for (suspected) fetal abnormality and damage, unspecified, fetus 1: Secondary | ICD-10-CM

## 2016-07-20 DIAGNOSIS — Z3A37 37 weeks gestation of pregnancy: Secondary | ICD-10-CM | POA: Diagnosis not present

## 2016-07-20 MED ORDER — LACTATED RINGERS IV BOLUS (SEPSIS)
1000.0000 mL | Freq: Once | INTRAVENOUS | Status: AC
  Administered 2016-07-20: 1000 mL via INTRAVENOUS

## 2016-07-20 NOTE — MAU Note (Signed)
Pt was here for MFM appointment and sent to MAU by MFM RN due to pt c/o contractions and pressure. [facility] due to fetal gastroschisis and SGA. [facility] for [facility] and was 3cm and sent home. Pt has been feeling contractions for several days. Pt denies bleeding and leaking of fluid. Pt states baby hasn't been moving normally for the last two days.

## 2016-07-20 NOTE — MAU Provider Note (Signed)
Ms Bailey Woods is a [id number] IUP 37 1/7 weeks with known fetal gastroschisis [facility]. Noted to be ctx. Sent to MAU for eval. Pt reports ut ctx for the last 2 days. Stronger this Am. [facility] cervix 3 cm. Denies any vaginal bleeding or LOF. Has noted decreased fetal movement.  PE  AF VSS   FHT's 150's  toco ut ctx q 2-5 mins Lungs clear  Heart RRR Abd soft gravid SVE 3/60  A/P IUP 37 1/7 weeks Fetal gastroschisis Non reactive NST with negative CST  Spoke with Dr. Sherrie Georgeecker, MFM who [facility] and fetal care is indicated. Reviewed POC with pt and agrees to transfer. Dr. Onnie BoerStephanie Pierce accepting attending.

## 2016-08-27 IMAGING — US US MFM FETAL BPP W/O NON-STRESS
1 series · 15 of 28 positions shown · non-contrast
Comparison: none

[Series 1: us mfm fetal bpp w/o non-stress · 51 acquisitions, 15 frames shown]
[im 1/51]
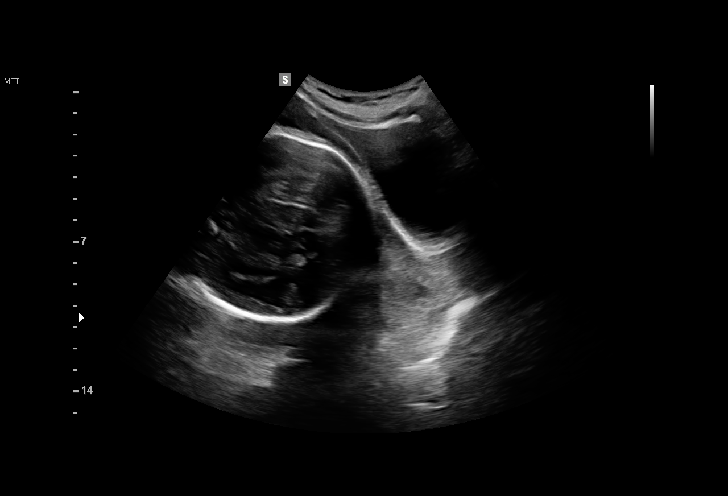
[im 4/51]
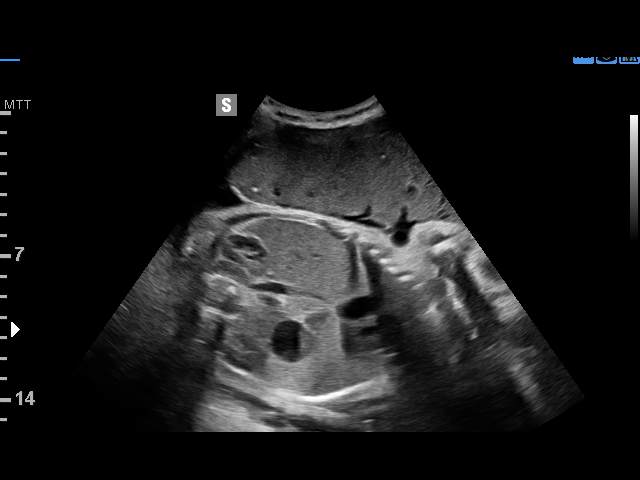
[im 8/51]
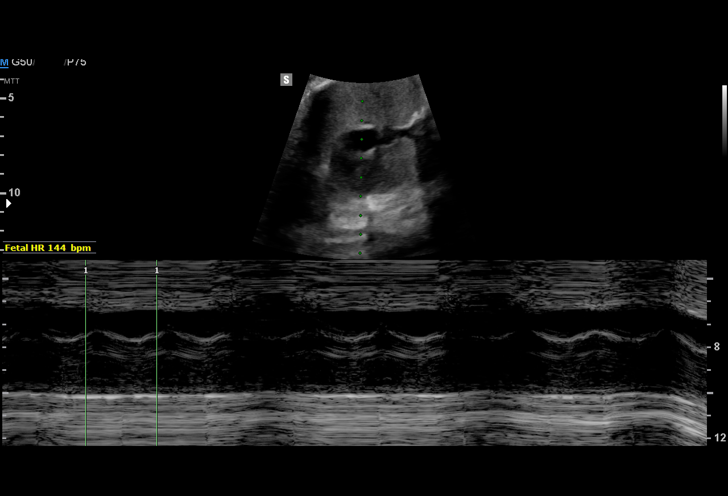
[im 12/51]
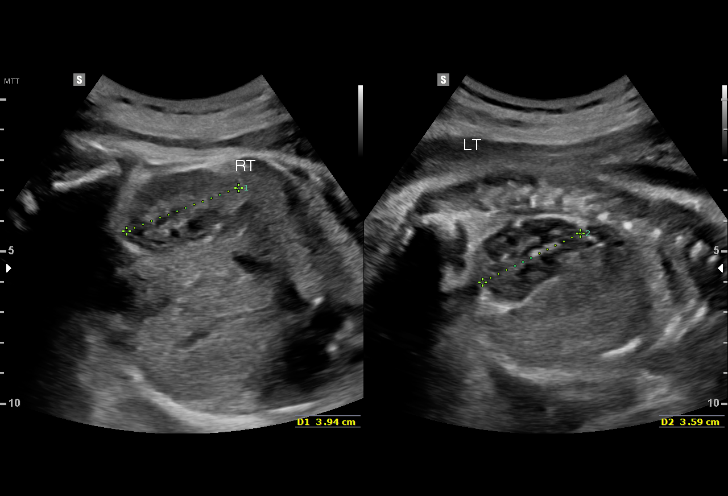
[im 15/51]
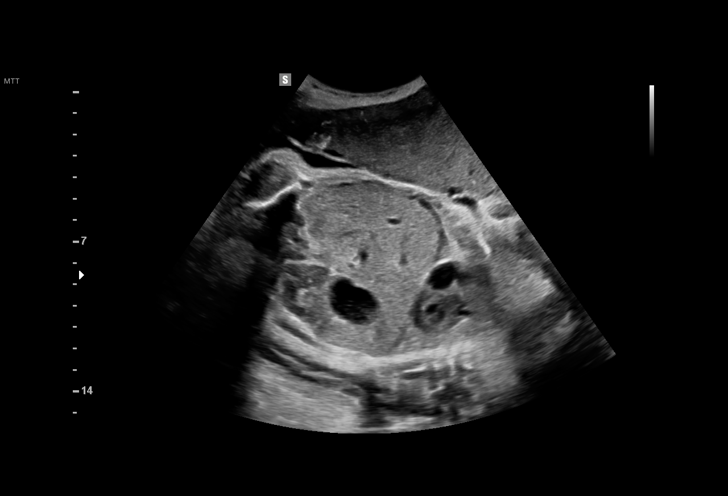
[im 19/51]
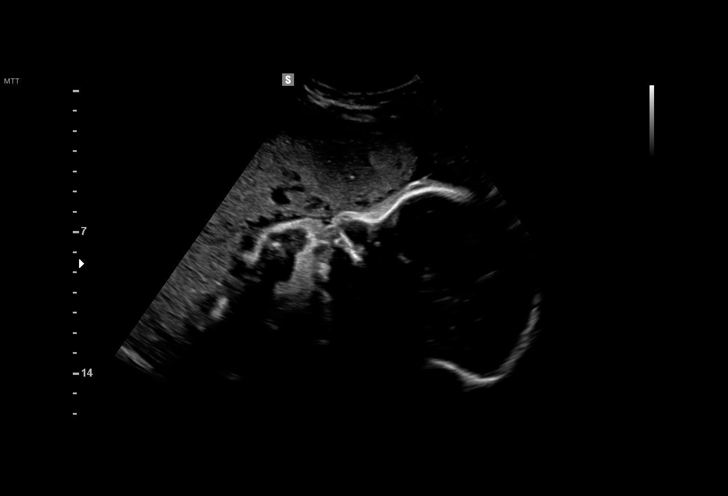
[im 23/51]
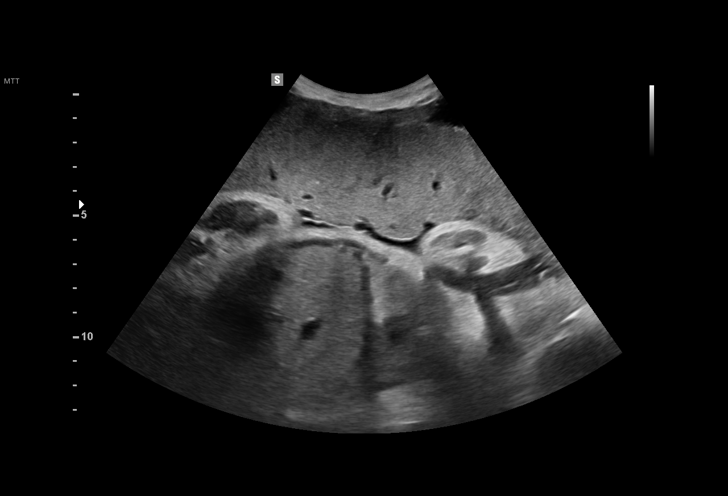
[im 26/51]
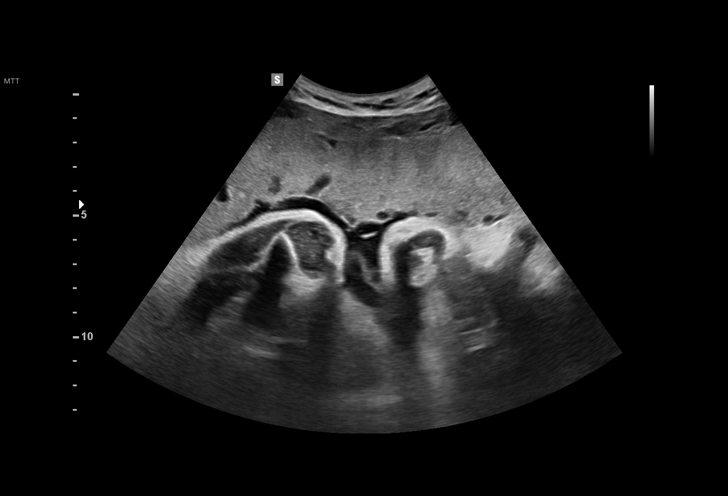
[im 28/51]
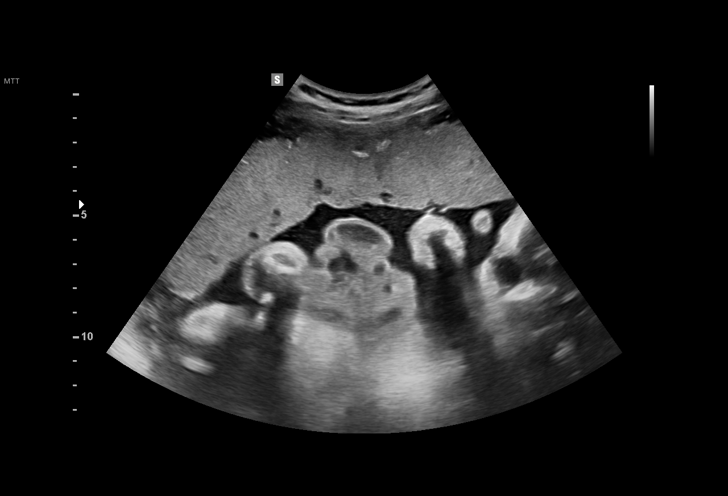
[im 32/51]
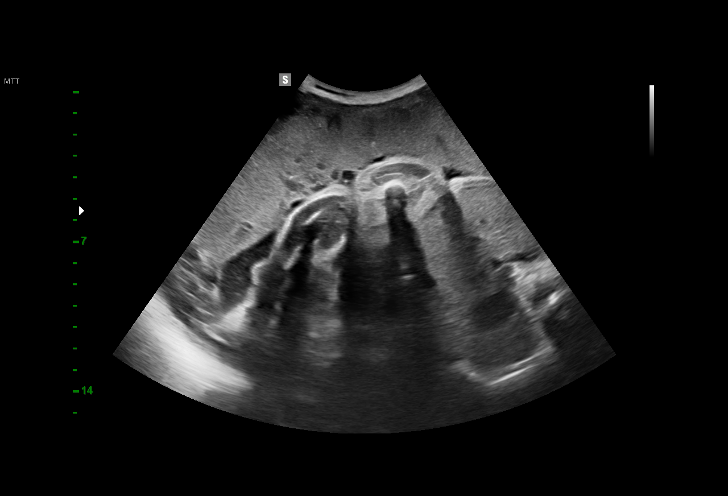
[im 36/51]
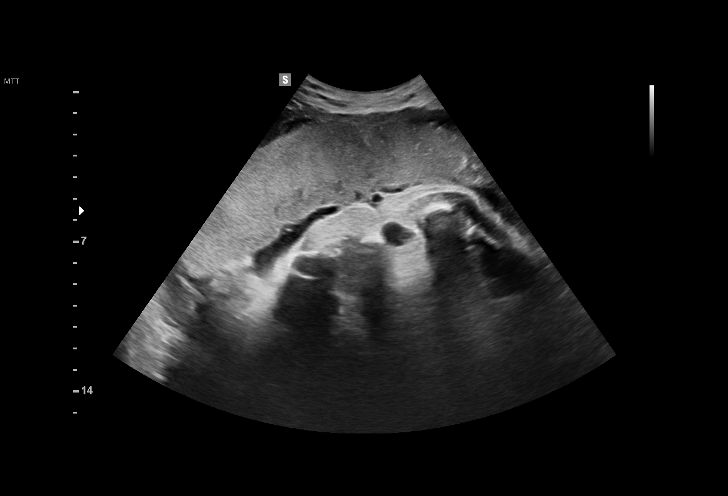
[im 39/51]
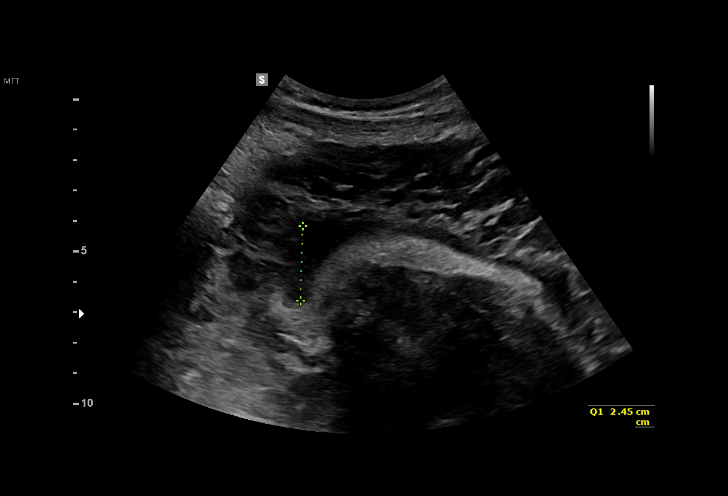
[im 43/51]
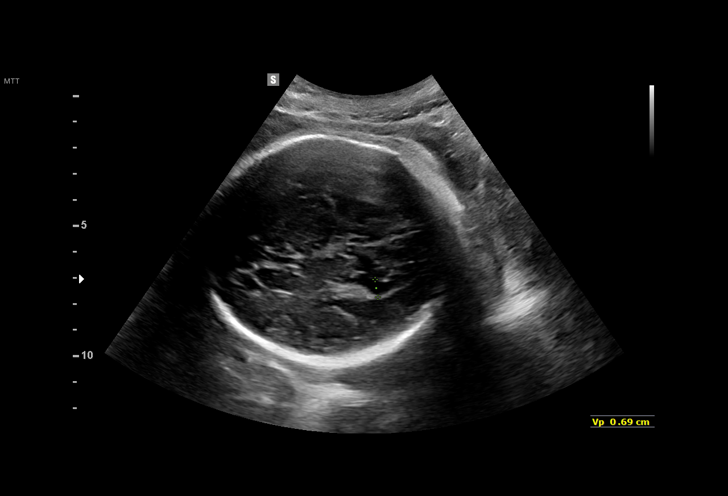
[im 47/51]
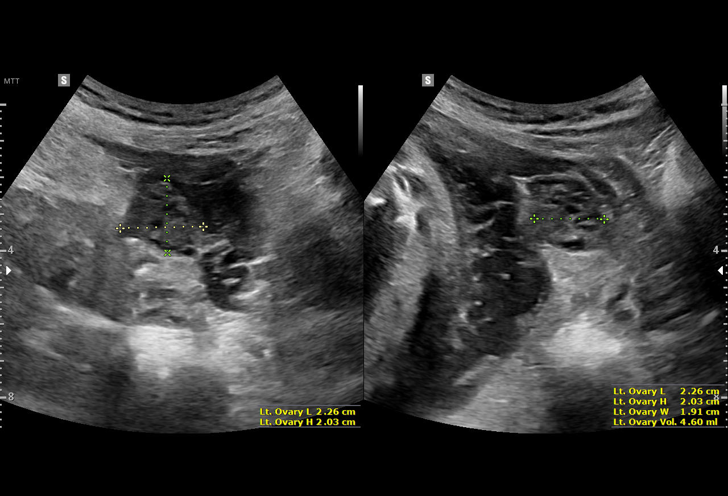
[im 51/51]
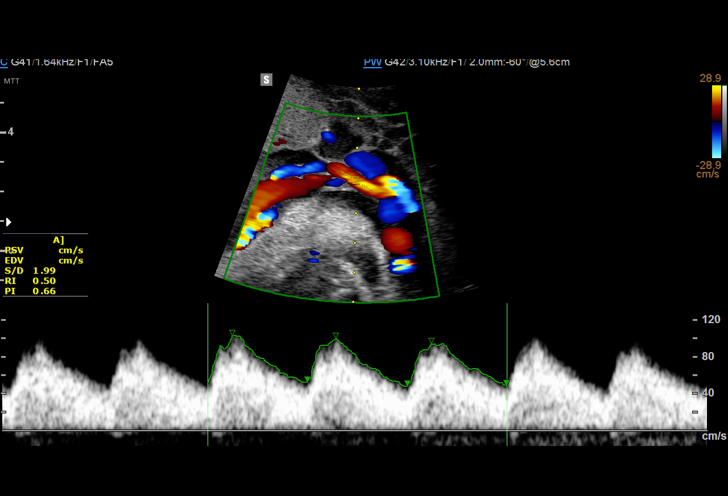

[15 of 28 positions shown; findings below may reference images not displayed]

[REDACTED]
OB/GYN

1  Tho            [id number]      [id number]     [id number]
2  MEXIRA MANIUS            [id number]      [id number]     [id number]
Indications

35 weeks gestation of pregnancy
Gastroschisis
History of cesarean delivery x 2 currently
pregnant
Maternal care for known or suspected poor
fetal growth, third trimester, not applicable or
unspecified
OB History

Gravidity:    3         Term:   2        Prem:   0        SAB:   0
TOP:          0       Ectopic:  0        Living: 2
Fetal Evaluation

Num Of Fetuses:     1
Fetal Heart         144
Rate(bpm):
Cardiac Activity:   Observed
Presentation:       Cephalic
Placenta:           Anterior, above cervical os
P. Cord Insertion:  Previously Visualized
Amniotic Fluid
AFI FV:      Subjectively within normal limits

AFI Sum(cm)     %Tile       Largest Pocket(cm)
11              28

RUQ(cm)       RLQ(cm)       LUQ(cm)        LLQ(cm)
2.45
Biophysical Evaluation

Amniotic F.V:   Within normal limits       F. Tone:        Observed
F. Movement:    Observed                   Score:          [DATE]
F. Breathing:   Observed
Gestational Age

LMP:           35w 1d       Date:   11/03/15                 EDD:   08/09/16
Best:          35w 1d    Det. By:   LMP  (11/03/15)          EDD:   08/09/16
Doppler - Fetal Vessels

Umbilical Artery
S/D     %tile     RI              PI              PSV    ADFV    RDFV
(cm/s)
2.03       23   0.51              0.7             89.01      No      No

Cervix Uterus Adnexa

Cervix
Not visualized (advanced GA >81wks)

Uterus
No abnormality visualized.

Left Ovary
Size(cm)     2.26  x    1.91   x  2.03      Vol(ml):
Within normal limits. No adnexal mass visualized.

Right Ovary
Size(cm)       2.9 x    1.65   x  2.09      Vol(ml):
Within normal limits. No adnexal mass visualized.

Cul De Sac:   No free fluid seen.

Adnexa:       No abnormality visualized.
Impression

SIUP at 35+1 weeks
Gastroschisis
Normal amniotic fluid volume
BPP [DATE]
UA dopplers were normal for this GA
Recommendations

Continue twice weekly NSTs with weekly AFIs or weekly BPPs

## 2016-12-29 ENCOUNTER — Encounter (HOSPITAL_COMMUNITY): Payer: Self-pay
